# Patient Record
Sex: Male | Born: 1955 | Race: White | Hispanic: No | Marital: Married | Smoking: Never smoker
Health system: Southern US, Community
[De-identification: ages and names within clinical notes are randomized; demographics above are authoritative.]

## PROBLEM LIST (undated history)

## (undated) DIAGNOSIS — D126 Benign neoplasm of colon, unspecified: Secondary | ICD-10-CM

## (undated) DIAGNOSIS — K859 Acute pancreatitis without necrosis or infection, unspecified: Secondary | ICD-10-CM

## (undated) DIAGNOSIS — K648 Other hemorrhoids: Secondary | ICD-10-CM

## (undated) DIAGNOSIS — J189 Pneumonia, unspecified organism: Secondary | ICD-10-CM

## (undated) DIAGNOSIS — E785 Hyperlipidemia, unspecified: Secondary | ICD-10-CM

## (undated) DIAGNOSIS — K5790 Diverticulosis of intestine, part unspecified, without perforation or abscess without bleeding: Secondary | ICD-10-CM

## (undated) DIAGNOSIS — K219 Gastro-esophageal reflux disease without esophagitis: Secondary | ICD-10-CM

## (undated) DIAGNOSIS — I10 Essential (primary) hypertension: Secondary | ICD-10-CM

## (undated) HISTORY — DX: Other hemorrhoids: K64.8

## (undated) HISTORY — DX: Gilbert syndrome: E80.4

## (undated) HISTORY — DX: Benign neoplasm of colon, unspecified: D12.6

## (undated) HISTORY — DX: Gastro-esophageal reflux disease without esophagitis: K21.9

## (undated) HISTORY — DX: Diverticulosis of intestine, part unspecified, without perforation or abscess without bleeding: K57.90

## (undated) HISTORY — PX: CHOLECYSTECTOMY: SHX55

## (undated) HISTORY — DX: Acute pancreatitis without necrosis or infection, unspecified: K85.90

## (undated) HISTORY — DX: Hyperlipidemia, unspecified: E78.5

## (undated) HISTORY — DX: Pneumonia, unspecified organism: J18.9

## (undated) HISTORY — DX: Essential (primary) hypertension: I10

---

## 1999-09-28 ENCOUNTER — Encounter: Payer: Self-pay | Admitting: Emergency Medicine

## 1999-09-28 ENCOUNTER — Emergency Department (HOSPITAL_COMMUNITY): Admission: EM | Admit: 1999-09-28 | Discharge: 1999-09-28 | Payer: Self-pay | Admitting: Emergency Medicine

## 1999-09-30 ENCOUNTER — Encounter (INDEPENDENT_AMBULATORY_CARE_PROVIDER_SITE_OTHER): Payer: Self-pay | Admitting: Specialist

## 1999-09-30 ENCOUNTER — Inpatient Hospital Stay (HOSPITAL_COMMUNITY): Admission: EM | Admit: 1999-09-30 | Discharge: 1999-10-02 | Payer: Self-pay | Admitting: Emergency Medicine

## 1999-09-30 ENCOUNTER — Encounter (INDEPENDENT_AMBULATORY_CARE_PROVIDER_SITE_OTHER): Payer: Self-pay | Admitting: *Deleted

## 1999-09-30 ENCOUNTER — Encounter: Payer: Self-pay | Admitting: Emergency Medicine

## 1999-10-01 ENCOUNTER — Encounter: Payer: Self-pay | Admitting: Family Medicine

## 1999-10-01 ENCOUNTER — Encounter: Payer: Self-pay | Admitting: Emergency Medicine

## 2000-08-01 ENCOUNTER — Emergency Department (HOSPITAL_COMMUNITY): Admission: EM | Admit: 2000-08-01 | Discharge: 2000-08-02 | Payer: Self-pay | Admitting: *Deleted

## 2001-01-18 ENCOUNTER — Emergency Department (HOSPITAL_COMMUNITY): Admission: EM | Admit: 2001-01-18 | Discharge: 2001-01-18 | Payer: Self-pay | Admitting: Emergency Medicine

## 2001-03-08 ENCOUNTER — Encounter: Payer: Self-pay | Admitting: Emergency Medicine

## 2001-03-08 ENCOUNTER — Inpatient Hospital Stay (HOSPITAL_COMMUNITY): Admission: EM | Admit: 2001-03-08 | Discharge: 2001-03-10 | Payer: Self-pay | Admitting: Emergency Medicine

## 2001-03-09 ENCOUNTER — Encounter: Payer: Self-pay | Admitting: General Surgery

## 2001-03-26 ENCOUNTER — Encounter: Admission: RE | Admit: 2001-03-26 | Discharge: 2001-03-26 | Payer: Self-pay | Admitting: Gastroenterology

## 2001-03-26 ENCOUNTER — Encounter: Payer: Self-pay | Admitting: Gastroenterology

## 2001-09-01 ENCOUNTER — Encounter: Payer: Self-pay | Admitting: Emergency Medicine

## 2001-09-01 ENCOUNTER — Emergency Department (HOSPITAL_COMMUNITY): Admission: EM | Admit: 2001-09-01 | Discharge: 2001-09-01 | Payer: Self-pay | Admitting: Emergency Medicine

## 2001-09-01 ENCOUNTER — Encounter: Payer: Self-pay | Admitting: Family Medicine

## 2001-09-04 ENCOUNTER — Emergency Department (HOSPITAL_COMMUNITY): Admission: EM | Admit: 2001-09-04 | Discharge: 2001-09-04 | Payer: Self-pay | Admitting: Emergency Medicine

## 2001-09-04 ENCOUNTER — Encounter: Payer: Self-pay | Admitting: Emergency Medicine

## 2001-09-28 ENCOUNTER — Inpatient Hospital Stay (HOSPITAL_COMMUNITY): Admission: EM | Admit: 2001-09-28 | Discharge: 2001-09-30 | Payer: Self-pay | Admitting: Emergency Medicine

## 2001-09-28 ENCOUNTER — Encounter: Payer: Self-pay | Admitting: Gastroenterology

## 2001-09-28 ENCOUNTER — Encounter (INDEPENDENT_AMBULATORY_CARE_PROVIDER_SITE_OTHER): Payer: Self-pay | Admitting: *Deleted

## 2001-09-30 ENCOUNTER — Encounter (INDEPENDENT_AMBULATORY_CARE_PROVIDER_SITE_OTHER): Payer: Self-pay | Admitting: *Deleted

## 2002-03-12 ENCOUNTER — Inpatient Hospital Stay (HOSPITAL_COMMUNITY): Admission: EM | Admit: 2002-03-12 | Discharge: 2002-03-13 | Payer: Self-pay | Admitting: Emergency Medicine

## 2002-03-12 ENCOUNTER — Encounter: Payer: Self-pay | Admitting: Emergency Medicine

## 2002-03-14 ENCOUNTER — Ambulatory Visit (HOSPITAL_COMMUNITY): Admission: RE | Admit: 2002-03-14 | Discharge: 2002-03-14 | Payer: Self-pay | Admitting: Gastroenterology

## 2002-03-14 ENCOUNTER — Encounter: Payer: Self-pay | Admitting: Gastroenterology

## 2002-03-15 ENCOUNTER — Ambulatory Visit (HOSPITAL_COMMUNITY): Admission: RE | Admit: 2002-03-15 | Discharge: 2002-03-15 | Payer: Self-pay | Admitting: Gastroenterology

## 2002-03-15 ENCOUNTER — Encounter: Payer: Self-pay | Admitting: Gastroenterology

## 2003-02-24 ENCOUNTER — Emergency Department (HOSPITAL_COMMUNITY): Admission: EM | Admit: 2003-02-24 | Discharge: 2003-02-24 | Payer: Self-pay | Admitting: Emergency Medicine

## 2003-05-12 ENCOUNTER — Emergency Department (HOSPITAL_COMMUNITY): Admission: EM | Admit: 2003-05-12 | Discharge: 2003-05-13 | Payer: Self-pay | Admitting: Emergency Medicine

## 2003-08-09 ENCOUNTER — Observation Stay (HOSPITAL_COMMUNITY): Admission: EM | Admit: 2003-08-09 | Discharge: 2003-08-10 | Payer: Self-pay | Admitting: Emergency Medicine

## 2003-08-18 ENCOUNTER — Encounter: Admission: RE | Admit: 2003-08-18 | Discharge: 2003-08-18 | Payer: Self-pay | Admitting: Internal Medicine

## 2004-10-26 ENCOUNTER — Ambulatory Visit: Payer: Self-pay | Admitting: Gastroenterology

## 2004-10-27 ENCOUNTER — Encounter (INDEPENDENT_AMBULATORY_CARE_PROVIDER_SITE_OTHER): Payer: Self-pay | Admitting: Specialist

## 2004-10-27 ENCOUNTER — Ambulatory Visit: Payer: Self-pay | Admitting: Gastroenterology

## 2004-11-08 ENCOUNTER — Encounter: Admission: RE | Admit: 2004-11-08 | Discharge: 2004-11-08 | Payer: Self-pay | Admitting: Gastroenterology

## 2004-12-03 ENCOUNTER — Ambulatory Visit (HOSPITAL_COMMUNITY): Admission: RE | Admit: 2004-12-03 | Discharge: 2004-12-03 | Payer: Self-pay | Admitting: Gastroenterology

## 2004-12-03 ENCOUNTER — Ambulatory Visit: Payer: Self-pay | Admitting: Gastroenterology

## 2005-02-18 ENCOUNTER — Ambulatory Visit: Payer: Self-pay | Admitting: Gastroenterology

## 2005-12-24 DIAGNOSIS — D126 Benign neoplasm of colon, unspecified: Secondary | ICD-10-CM

## 2005-12-24 HISTORY — DX: Benign neoplasm of colon, unspecified: D12.6

## 2005-12-26 ENCOUNTER — Encounter (INDEPENDENT_AMBULATORY_CARE_PROVIDER_SITE_OTHER): Payer: Self-pay | Admitting: *Deleted

## 2006-01-13 ENCOUNTER — Encounter (INDEPENDENT_AMBULATORY_CARE_PROVIDER_SITE_OTHER): Payer: Self-pay | Admitting: *Deleted

## 2006-01-13 ENCOUNTER — Ambulatory Visit (HOSPITAL_COMMUNITY): Admission: RE | Admit: 2006-01-13 | Discharge: 2006-01-13 | Payer: Self-pay | Admitting: Gastroenterology

## 2006-05-22 ENCOUNTER — Ambulatory Visit: Payer: Self-pay | Admitting: Gastroenterology

## 2006-07-08 IMAGING — CT CT ABDOMEN W/ CM
1 of 6 series · 14 of 36 positions shown, 18 images · IV contrast (VOLUMEN & [ID] OMNI 300)
Comparison: none

CLINICAL DATA: Abdominal pain.  Gastroenteritis.  
ABDOMEN CT WITH CONTRAST:
TECHNIQUE: Multidetector CT imaging of the abdomen was performed following the standard protocol during bolus administration of intravenous contrast.
Contrast:  125 cc Omnipaque 300 and oral contrast.  Negative contrast was utilized to visualize the stomach and small bowel.  In addition to the axial images sagittal and coronal images were reconstructed and reviewed.
TECHNIQUE: Multidetector CT imaging of the pelvis was performed following the standard protocol during bolus administration of intravenous contrast.

[Series 4: recon 3: routine abdomen · axial · 0.86mm/px · z∈[-402,+17]mm · 14 of 369 slices shown, 18 images]
[im 17/369  soft-tissue]
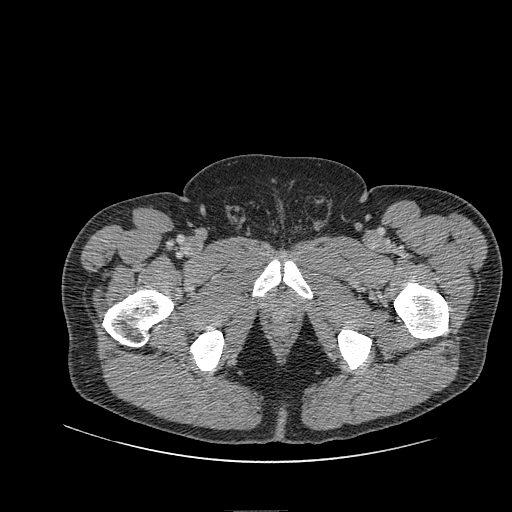
[im 17/369  bone]
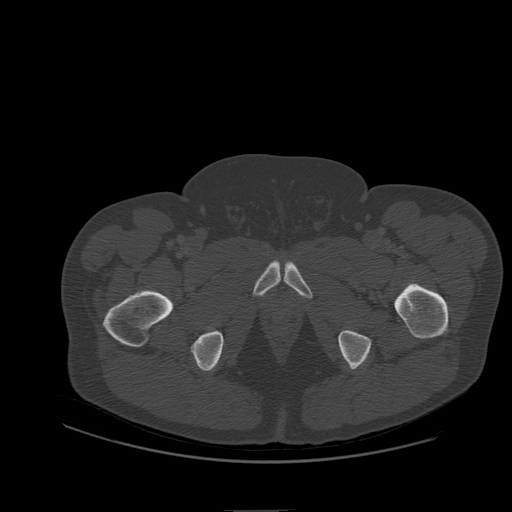
[im 51/369  soft-tissue]
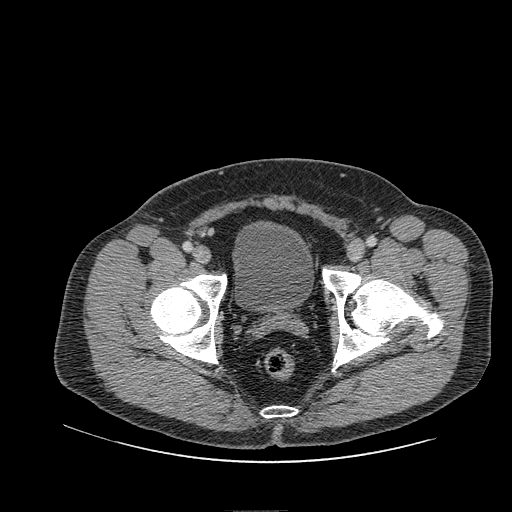
[im 84/369  soft-tissue]
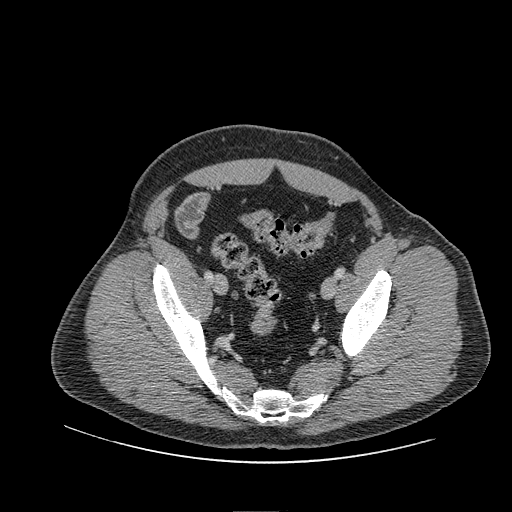
[im 118/369  soft-tissue]
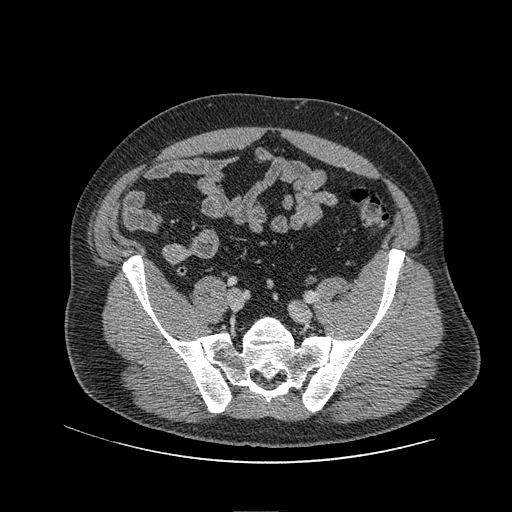
[im 134/369  soft-tissue]
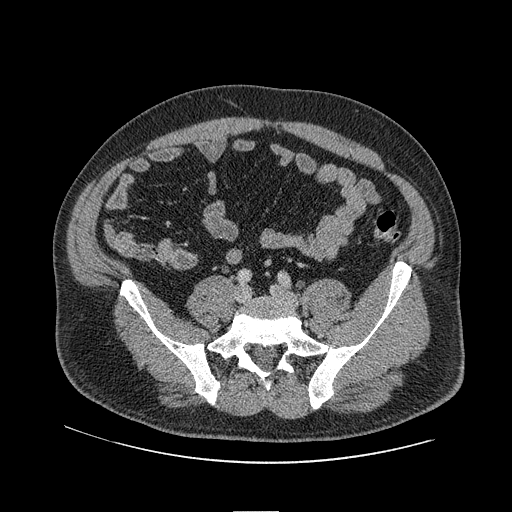
[im 168/369  soft-tissue]
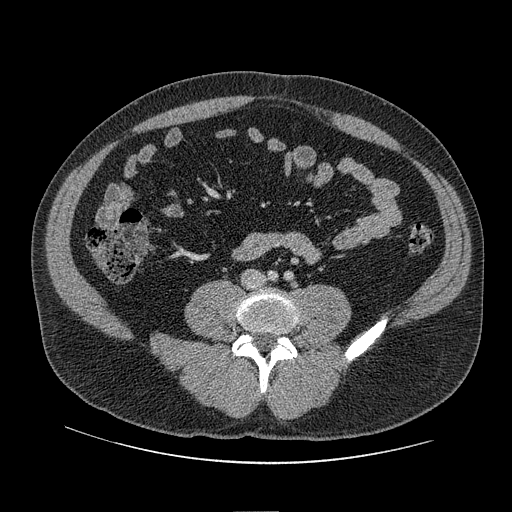
[im 201/369  soft-tissue]
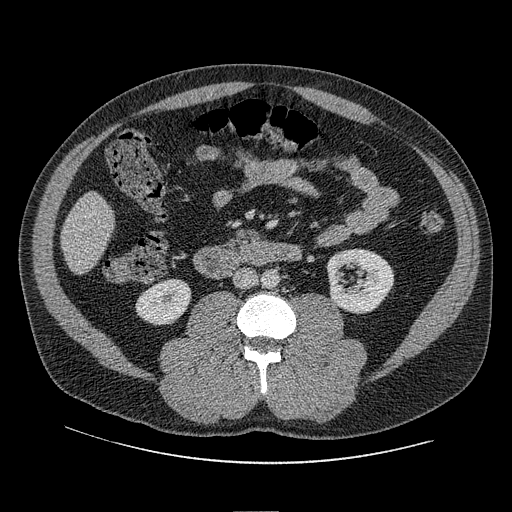
[im 235/369  soft-tissue]
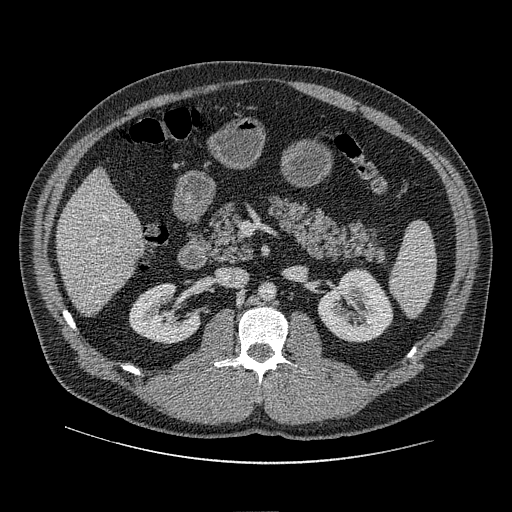
[im 251/369  soft-tissue]
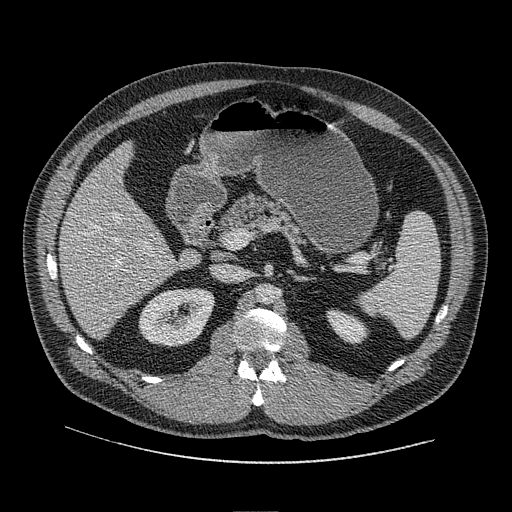
[im 251/369  bone]
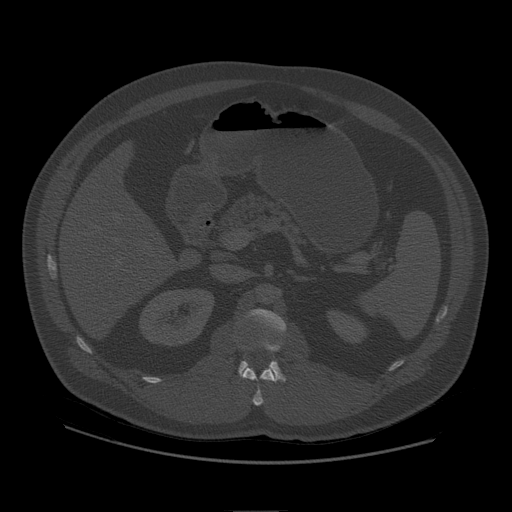
[im 285/369  soft-tissue]
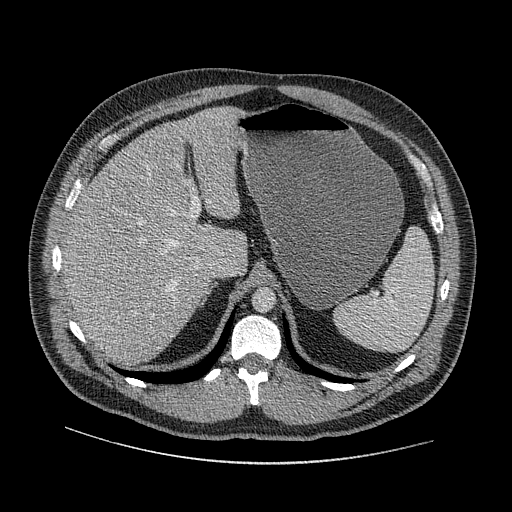
[im 302/369  lung]
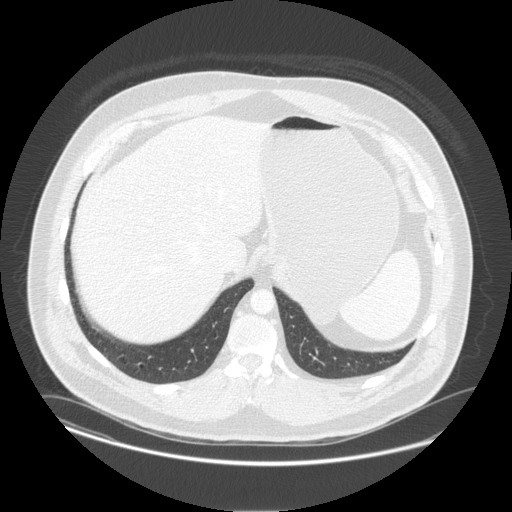
[im 318/369  soft-tissue]
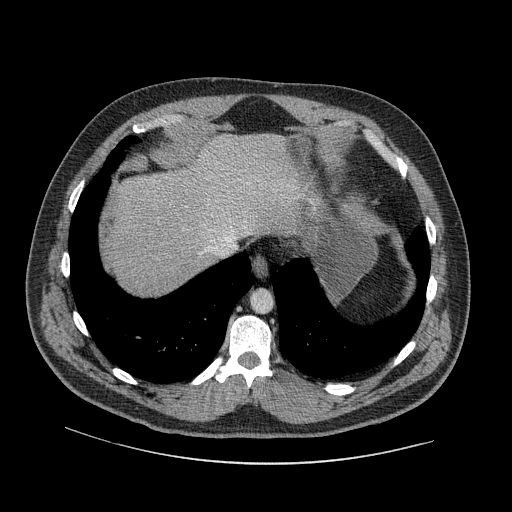
[im 318/369  lung]
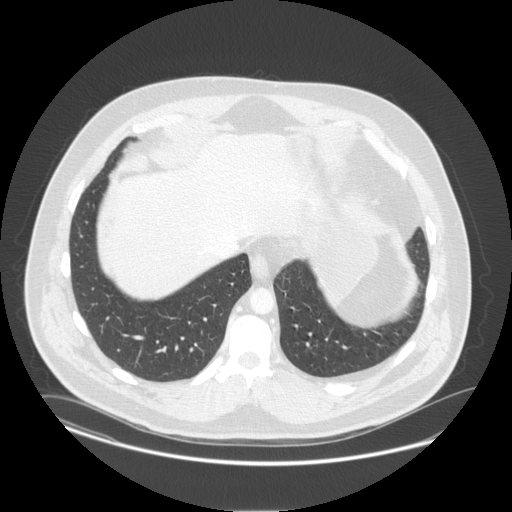
[im 335/369  lung]
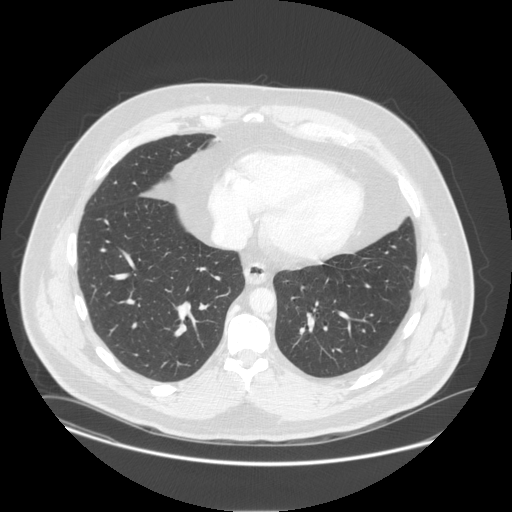
[im 352/369  soft-tissue]
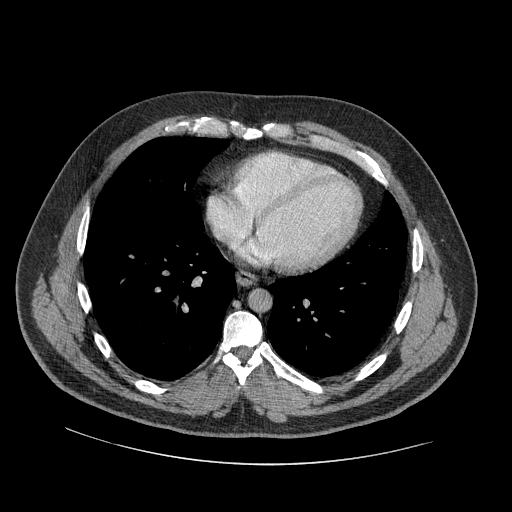
[im 352/369  lung]
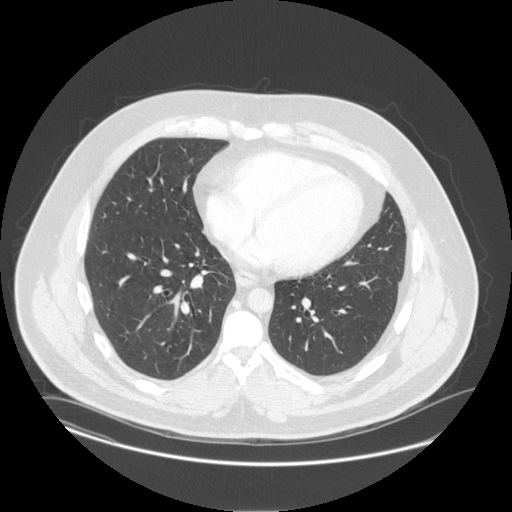

[14 of 36 positions shown; findings below may reference images not displayed]

FINDINGS: The lung bases are clear.  The liver enhances normally with no focal abnormality and no ductal dilatation is seen.  Surgical clips are present from prior cholecystectomy.  The stomach is well distended with  negative oral contrast.  Within the duodenal bulb medially there is an oval soft tissue mass present.  This measures approximately 30 x 15 mm on image number 31.  A small amount of air is located just medial to this ?soft tissue mass? and this may be air trapped behind a possible duodenal bulb lesion.  It is unusual to see a sizable duodenal diverticulum at this location although redundant mucosa of a duodenal diverticulum is difficult to exclude.  Duodenal neoplasm is also a primary consideration.  The remainder of the small bowel appears grossly normal although several loops are not well distended and therefore difficult to assess.  The terminal ileum is moderately well seen with no evidence of edema or stricture.  The appendix appears normal.  The remainder of the study shows the pancreas to be normal in size.  The adrenal glands and spleen appear normal.  The kidneys enhance normally and on delayed images the pelvocaliceal systems appear normal.  The ureters are normal in caliber.  Abdominal aorta appears normal.
IMPRESSION: 1.  Oval soft tissue mass in the duodenum ? immediate postbulbar duodenum located along the medial wall measuring 30 x 15 mm.  Consider neoplasm although redundant mucosa of a duodenal diverticulum is also a consideration. 
2.  Portions of the small bowel are not well distended and therefore cannot be assessed.  The appendix and terminal ileum appear normal. 
PELVIS CT WITH CONTRAST:
FINDINGS: The distal portion of the appendix is well seen and appears normal.  Rectosigmoid colonic diverticula are present.  The urinary bladder is unremarkable.  No bony abnormality is seen.
IMPRESSION: Negative CT of the pelvis. 
The findings of this study were discussed with Dr. Marelys Servante, and repeat endoscopy is to be performed.

## 2007-03-14 ENCOUNTER — Ambulatory Visit: Payer: Self-pay | Admitting: Gastroenterology

## 2008-04-08 ENCOUNTER — Encounter: Payer: Self-pay | Admitting: Gastroenterology

## 2008-05-22 ENCOUNTER — Ambulatory Visit: Payer: Self-pay | Admitting: Gastroenterology

## 2008-05-22 DIAGNOSIS — K219 Gastro-esophageal reflux disease without esophagitis: Secondary | ICD-10-CM

## 2008-05-22 DIAGNOSIS — Z860100 Personal history of colon polyps, unspecified: Secondary | ICD-10-CM | POA: Insufficient documentation

## 2008-06-10 ENCOUNTER — Encounter: Payer: Self-pay | Admitting: Gastroenterology

## 2008-08-28 ENCOUNTER — Telehealth: Payer: Self-pay | Admitting: Physician Assistant

## 2008-08-28 ENCOUNTER — Telehealth: Payer: Self-pay | Admitting: Gastroenterology

## 2008-08-28 ENCOUNTER — Ambulatory Visit: Payer: Self-pay | Admitting: Gastroenterology

## 2008-08-28 DIAGNOSIS — R141 Gas pain: Secondary | ICD-10-CM | POA: Insufficient documentation

## 2008-08-28 DIAGNOSIS — R143 Flatulence: Secondary | ICD-10-CM

## 2008-08-28 DIAGNOSIS — R1084 Generalized abdominal pain: Secondary | ICD-10-CM | POA: Insufficient documentation

## 2008-08-28 DIAGNOSIS — R142 Eructation: Secondary | ICD-10-CM

## 2008-08-28 LAB — CONVERTED CEMR LAB
ALT: 30 units/L (ref 0–53)
AST: 20 units/L (ref 0–37)
Albumin: 4.2 g/dL (ref 3.5–5.2)
Alkaline Phosphatase: 24 units/L — ABNORMAL LOW (ref 39–117)
BUN: 14 mg/dL (ref 6–23)
Basophils Absolute: 0 10*3/uL (ref 0.0–0.1)
Basophils Relative: 0.2 % (ref 0.0–3.0)
CO2: 25 meq/L (ref 19–32)
Calcium: 9.4 mg/dL (ref 8.4–10.5)
Chloride: 101 meq/L (ref 96–112)
Creatinine, Ser: 1.1 mg/dL (ref 0.4–1.5)
Eosinophils Absolute: 0.1 10*3/uL (ref 0.0–0.7)
Eosinophils Relative: 0.5 % (ref 0.0–5.0)
GFR calc non Af Amer: 74.76 mL/min (ref >60)
Glucose, Bld: 113 mg/dL — ABNORMAL HIGH (ref 70–99)
HCT: 45.4 % (ref 39.0–52.0)
Hemoglobin: 16.1 g/dL (ref 13.0–17.0)
Lipase: 8 units/L — ABNORMAL LOW (ref 11.0–59.0)
Lymphocytes Relative: 23.6 % (ref 12.0–46.0)
Lymphs Abs: 2.4 10*3/uL (ref 0.7–4.0)
MCHC: 35.5 g/dL (ref 30.0–36.0)
MCV: 90.6 fL (ref 78.0–100.0)
Monocytes Absolute: 0.9 10*3/uL (ref 0.1–1.0)
Monocytes Relative: 9.1 % (ref 3.0–12.0)
Neutro Abs: 6.9 10*3/uL (ref 1.4–7.7)
Neutrophils Relative %: 66.6 % (ref 43.0–77.0)
Platelets: 238 10*3/uL (ref 150.0–400.0)
Potassium: 3.8 meq/L (ref 3.5–5.1)
RBC: 5.02 M/uL (ref 4.22–5.81)
RDW: 12.1 % (ref 11.5–14.6)
Sodium: 137 meq/L (ref 135–145)
Total Bilirubin: 1.3 mg/dL — ABNORMAL HIGH (ref 0.3–1.2)
Total Protein: 7 g/dL (ref 6.0–8.3)
WBC: 10.3 10*3/uL (ref 4.5–10.5)

## 2008-09-01 ENCOUNTER — Telehealth: Payer: Self-pay | Admitting: Gastroenterology

## 2008-09-01 DIAGNOSIS — R197 Diarrhea, unspecified: Secondary | ICD-10-CM

## 2008-09-01 DIAGNOSIS — R110 Nausea: Secondary | ICD-10-CM

## 2008-09-02 ENCOUNTER — Inpatient Hospital Stay (HOSPITAL_COMMUNITY): Admission: AD | Admit: 2008-09-02 | Discharge: 2008-09-04 | Payer: Self-pay | Admitting: Gastroenterology

## 2008-09-02 ENCOUNTER — Ambulatory Visit: Payer: Self-pay | Admitting: Internal Medicine

## 2008-09-02 ENCOUNTER — Ambulatory Visit: Payer: Self-pay | Admitting: Gastroenterology

## 2008-09-02 ENCOUNTER — Telehealth: Payer: Self-pay | Admitting: Gastroenterology

## 2008-09-04 ENCOUNTER — Telehealth: Payer: Self-pay | Admitting: Gastroenterology

## 2008-10-07 ENCOUNTER — Ambulatory Visit: Payer: Self-pay | Admitting: Gastroenterology

## 2008-11-27 ENCOUNTER — Encounter (INDEPENDENT_AMBULATORY_CARE_PROVIDER_SITE_OTHER): Payer: Self-pay | Admitting: *Deleted

## 2009-02-12 ENCOUNTER — Telehealth: Payer: Self-pay | Admitting: Gastroenterology

## 2009-07-08 ENCOUNTER — Telehealth: Payer: Self-pay | Admitting: Gastroenterology

## 2009-07-28 ENCOUNTER — Encounter (INDEPENDENT_AMBULATORY_CARE_PROVIDER_SITE_OTHER): Payer: Self-pay | Admitting: *Deleted

## 2009-09-02 ENCOUNTER — Encounter (INDEPENDENT_AMBULATORY_CARE_PROVIDER_SITE_OTHER): Payer: Self-pay | Admitting: *Deleted

## 2009-09-07 ENCOUNTER — Ambulatory Visit: Payer: Self-pay | Admitting: Gastroenterology

## 2009-09-07 ENCOUNTER — Encounter (INDEPENDENT_AMBULATORY_CARE_PROVIDER_SITE_OTHER): Payer: Self-pay | Admitting: *Deleted

## 2009-09-18 ENCOUNTER — Ambulatory Visit: Payer: Self-pay | Admitting: Gastroenterology

## 2009-09-21 ENCOUNTER — Encounter: Payer: Self-pay | Admitting: Gastroenterology

## 2010-02-25 NOTE — Miscellaneous (Signed)
Summary: [facility]  Clinical Lists Changes  Medications: Added new medication of MOVIPREP 100 GM  SOLR (PEG-KCL-NACL-NASULF-NA ASC-C) As per prep instructions. - Signed Rx of MOVIPREP 100 GM  SOLR (PEG-KCL-NACL-NASULF-NA ASC-C) As per prep instructions.;  #1 x 0;  Signed;  Entered by: Ezra Sites RN;  Authorized by: Meryl Dare MD Clementeen Graham;  Method used: Electronically to [facility]*, [address], [place], [place]  [address], Ph: [phone] or [phone], Fax: [phone] Observations: Added new observation of ALLERGY REV: Done (09/07/2009 7:48)    Prescriptions: MOVIPREP 100 GM  SOLR (PEG-KCL-NACL-NASULF-NA ASC-C) As per prep instructions.  #1 x 0   Entered by:   Ezra Sites RN   Authorized by:   Meryl Dare MD [facility]   Signed by:   Ezra Sites RN on 09/07/2009   Method used:   Electronically to        [facility]* (retail)       [address] [place], [place]  [address]       Ph: [phone] or [phone]       Fax: [phone]   RxID:   [id number]

## 2010-02-25 NOTE — Letter (Signed)
Summary: Diabetic Instructions  [facility]  [address] [place], [place] [address]   Phone: [phone]  Fax: [phone]    KODA ROUTON 12-31-1955 MRN: [id number]   (Glucophage)  ORAL DIABETIC MEDICATION INSTRUCTIONS  The day before your procedure:   Take your diabetic pill as you do normally  The day of your procedure:   Do not take your diabetic pill    We will check your blood sugar levels during the admission process and again in Recovery before discharging you home  ________________________________________________________________________

## 2010-02-25 NOTE — Progress Notes (Signed)
Summary: Triage   Phone Note Call from Patient Call back at [phone]   Caller: Wife  Dina Call For: Dr. Russella Dar Reason for Call: Talk to Nurse Summary of Call: Pt.'s wife said he is taking a "green" pill. Doesn't know the name of it and says it is not working. Wants something else called in Initial call taken by: Karna Christmas,  February 12, 2009 3:02 PM  Follow-up for Phone Call        Patient  had some nausea that started after some dental work and working last night. The green pill she is referring to is Levsin.  I advised the wife that levsin is not for nausea.  She says hsi stomach is not relaxed.  I advised her to increasae to 2 levsin q 4-6 hours as needed.   Follow-up by: Darcey Nora RN, CGRN,  February 12, 2009 3:28 PM

## 2010-02-25 NOTE — Procedures (Signed)
Summary: Colonoscopy  Patient: Pedro [name] Note: All result statuses are Final unless otherwise noted.  Tests: (1) Colonoscopy (COL)   COL Colonoscopy           DONE     [facility]     [address]     [place], [place]  [address]           COLONOSCOPY PROCEDURE REPORT     PATIENT:  Pedro, [name]  MR#:  [id number]     BIRTHDATE:  26-May-1955, 53 yrs. old  GENDER:  male     ENDOSCOPIST:  Judie Petit T. 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     MEDICATIONS:   Fentanyl 50 mcg IV, Versed 6 mg IV     DESCRIPTION OF PROCEDURE:   After the risks benefits and     alternatives of the procedure were thoroughly explained, informed     consent was obtained.  Digital rectal exam was performed and     revealed no abnormalities.   The LB [id number]-[id number] endoscope     was introduced through the anus and advanced to the cecum, which     was identified by both the appendix and ileocecal valve, without     limitations.  The quality of the prep was excellent, using     MoviPrep.  The instrument was then slowly withdrawn as the colon     was fully examined.     <<PROCEDUREIMAGES>>     FINDINGS:  Mild diverticulosis was found in the sigmoid colon. A     normal appearing cecum, ileocecal valve, and appendiceal orifice     were identified. The ascending, hepatic flexure, transverse,     splenic flexure, descending colon, and rectum appeared     unremarkable. Retroflexed views in the rectum revealed no     abnormalities. The time to cecum =  2  minutes. The scope was then     withdrawn (time =  9  min) from the patient and the procedure     completed.     COMPLICATIONS:  None           ENDOSCOPIC IMPRESSION:     1) Mild diverticulosis in the sigmoid colon           RECOMMENDATIONS:     1) High  fiber diet with liberal fluid intake.     2) Repeat Colonoscopy in 5 years.           Venita Lick. Russella Dar, MD, [facility]           CC: [facility]           n.     Rosalie DoctorVenita Lick. Stark at 09/18/2009 11:25 AM           Michel Bickers, [id number]  Note: An exclamation mark (!) indicates a result that was not dispersed into the flowsheet. Document Creation Date: 09/18/2009 11:26 AM _______________________________________________________________________  (1) Order result status: Final Collection or observation date-time: 09/18/2009 11:23 Requested date-time:  Receipt date-time:  Reported date-time:  Referring Physician:   Ordering Physician: Claudette Head [phone]) Specimen Source:  Source: Launa Grill Order Number: [id number] Lab site:   Appended  Document: Colonoscopy    Clinical Lists Changes  Observations: Added new observation of COLONNXTDUE: 08/2014 (09/18/2009 11:32)

## 2010-02-25 NOTE — Progress Notes (Signed)
Summary: Schedule Colonoscopy   Phone Note Outgoing Call Call back at [facility] Phone [phone]   Call placed by: Harlow Mares CMA Duncan Dull),  July 08, 2009 4:24 PM Call placed to: Patient Summary of Call: Left a message on the patient machine to call back and schedule a previsit and procedure with our office. pt due for colonoscopy  Initial call taken by: Harlow Mares CMA Duncan Dull),  July 08, 2009 4:24 PM  Follow-up for Phone Call        Left a message on the patient machine to call back and schedule a previsit and procedure with our office. A letter will be mailed to the patient.  Follow-up by: Harlow Mares CMA (AAMA),  July 23, 2009 12:12 PM

## 2010-02-25 NOTE — Miscellaneous (Signed)
Summary: Hyomax refill  Clinical Lists Changes  Medications: Changed medication from LEVSIN 0.125 MG TABS (HYOSCYAMINE SULFATE) Put tab under tongue as needed to HYOMAX-SL 0.125 MG SUBL (HYOSCYAMINE SULFATE) 1 tablets by mouth under tongue four times a day as needed - Signed Rx of HYOMAX-SL 0.125 MG SUBL (HYOSCYAMINE SULFATE) 1 tablets by mouth under tongue four times a day as needed;  #360 x 3;  Signed;  Entered by: Christie Nottingham CMA (AAMA);  Authorized by: Meryl Dare MD Clementeen Graham;  Method used: Electronically to [facility]*, , ,   , Ph: [phone], Fax: [phone]    Prescriptions: HYOMAX-SL 0.125 MG SUBL (HYOSCYAMINE SULFATE) 1 tablets by mouth under tongue four times a day as needed  #360 x 3   Entered by:   Christie Nottingham CMA (AAMA)   Authorized by:   Meryl Dare MD [facility]   Signed by:   Christie Nottingham CMA (AAMA) on 09/21/2009   Method used:   Electronically to        [facility]* (retail)             ,          Ph: [phone]       Fax: [phone]   RxID:   [id number]

## 2010-02-25 NOTE — Letter (Signed)
Summary: [facility] Instructions  [facility]  [address] [place], [place] [address]   Phone: [phone]  Fax: [phone]       Pedro [name]    09/10/1955    MRN: [id number]        Procedure Day /Date:  Friday 09/18/2009     Arrival Time: 9:30 am      Procedure Time: 10:30 am     Location of Procedure:                    _x _   Endoscopy Center (4th Floor)                        PREPARATION FOR COLONOSCOPY WITH MOVIPREP   Starting 5 days prior to your procedure Sunday 8/21 do not eat nuts, seeds, popcorn, corn, beans, peas,  salads, or any raw vegetables.  Do not take any fiber supplements (e.g. Metamucil, Citrucel, and Benefiber).  THE DAY BEFORE YOUR PROCEDURE         DATE: Thursday 8/25 1.  Drink clear liquids the entire day-NO SOLID FOOD  2.  Do not drink anything colored red or purple.  Avoid juices with pulp.  No orange juice.  3.  Drink at least 64 oz. (8 glasses) of fluid/clear liquids during the day to prevent dehydration and help the prep work efficiently.  CLEAR LIQUIDS INCLUDE: Water Jello Ice Popsicles Tea (sugar ok, no milk/cream) Powdered fruit flavored drinks Coffee (sugar ok, no milk/cream) Gatorade Juice: apple, white grape, white cranberry  Lemonade Clear bullion, consomm, broth Carbonated beverages (any kind) Strained chicken noodle soup Hard Candy                             4.  In the morning, mix first dose of MoviPrep solution:    Empty 1 Pouch A and 1 Pouch B into the disposable container    Add lukewarm drinking water to the top line of the container. Mix to dissolve    Refrigerate (mixed solution should be used within 24 hrs)  5.  Begin drinking the prep at 5:00 p.m. The MoviPrep container is divided by 4 marks.   Every 15 minutes drink the solution down to the next mark (approximately 8 oz) until the full liter is complete.   6.  Follow completed prep with 16 oz of clear liquid of your choice (Nothing  red or purple).  Continue to drink clear liquids until bedtime.  7.  Before going to bed, mix second dose of MoviPrep solution:    Empty 1 Pouch A and 1 Pouch B into the disposable container    Add lukewarm drinking water to the top line of the container. Mix to dissolve    Refrigerate  THE DAY OF YOUR PROCEDURE      DATE: Friday 8/26  Beginning at 5:30 a.m. (5 hours before procedure):         1. Every 15 minutes, drink the solution down to the next mark (approx 8 oz) until the full liter is complete.  2. Follow completed prep with 16 oz. of clear liquid of your choice.    3. You may drink clear liquids until 8:30 am (2 HOURS BEFORE PROCEDURE).   MEDICATION INSTRUCTIONS  Unless otherwise instructed, you should take regular prescription medications with a small sip of water   as early as possible the morning of your  procedure.  Diabetic patients - see separate instructions.   Additional medication instructions: Hold Lisinopril/HCTZ day of procedure.         OTHER INSTRUCTIONS  You will need a responsible adult at least 54 years of age to accompany you and drive you home.   This person must remain in the waiting room during your procedure.  Wear loose fitting clothing that is easily removed.  Leave jewelry and other valuables at home.  However, you may wish to bring a book to read or  an iPod/MP3 player to listen to music as you wait for your procedure to start.  Remove all body piercing jewelry and leave at home.  Total time from sign-in until discharge is approximately 2-3 hours.  You should go home directly after your procedure and rest.  You can resume normal activities the  day after your procedure.  The day of your procedure you should not:   Drive   Make legal decisions   Operate machinery   Drink alcohol   Return to work  You will receive specific instructions about eating, activities and medications before you leave.    The above instructions  have been reviewed and explained to me by   Ezra Sites RN  September 07, 2009 8:09 AM     I fully understand and can verbalize these instructions _____________________________ Date _________

## 2010-02-25 NOTE — Letter (Signed)
Summary: Previsit letter  [facility]  [address] [place], [place] [address]   Phone: [phone]  Fax: [phone]       07/28/2009 MRN: [id number]  Pedro [name] [address] [place], [place]  [address]  Dear [name],  Welcome to the Gastroenterology Division at [facility].    You are scheduled to see a nurse for your pre-procedure visit on 09/07/2009 at 8:00am on the 3rd floor at [facility], [address]  We ask that you try to arrive at our office 15 minutes prior to your appointment time to allow for check-in.  Your nurse visit will consist of discussing your medical and surgical history, your immediate family medical history, and your medications.    Please bring a complete list of all your medications or, if you prefer, bring the medication bottles and we will list them.  We will need to be aware of both prescribed and over the counter drugs.  We will need to know exact dosage information as well.  If you are on blood thinners (Coumadin, Plavix, Aggrenox, Ticlid, etc.) please call our office today/prior to your appointment, as we need to consult with your physician about holding your medication.   Please be prepared to read and sign documents such as consent forms, a financial agreement, and acknowledgement forms.  If necessary, and with your consent, a friend or relative is welcome to sit-in on the nurse visit with you.  Please bring your insurance card so that we may make a copy of it.  If your insurance requires a referral to see a specialist, please bring your referral form from your primary care physician.  No co-pay is required for this nurse visit.     If you cannot keep your appointment, please call [phone] to cancel or reschedule prior to your appointment date.  This allows [place] the opportunity to schedule an appointment for another patient in need of care.    Thank you for choosing [facility] for your medical  needs.  We appreciate the opportunity to care for you.  Please visit [place] at our website  to learn more about our practice.                     Sincerely.                                                                                                                   The Gastroenterology Division

## 2010-04-06 ENCOUNTER — Encounter: Payer: Self-pay | Admitting: Family Medicine

## 2010-04-06 ENCOUNTER — Ambulatory Visit (INDEPENDENT_AMBULATORY_CARE_PROVIDER_SITE_OTHER): Payer: 59 | Admitting: Family Medicine

## 2010-04-06 ENCOUNTER — Other Ambulatory Visit: Payer: Self-pay | Admitting: Family Medicine

## 2010-04-06 DIAGNOSIS — I10 Essential (primary) hypertension: Secondary | ICD-10-CM | POA: Insufficient documentation

## 2010-04-06 DIAGNOSIS — E119 Type 2 diabetes mellitus without complications: Secondary | ICD-10-CM

## 2010-04-06 DIAGNOSIS — E785 Hyperlipidemia, unspecified: Secondary | ICD-10-CM

## 2010-04-06 DIAGNOSIS — E1169 Type 2 diabetes mellitus with other specified complication: Secondary | ICD-10-CM | POA: Insufficient documentation

## 2010-04-06 LAB — HEPATIC FUNCTION PANEL
ALT: 51 U/L (ref 0–53)
AST: 31 U/L (ref 0–37)
Albumin: 4.4 g/dL (ref 3.5–5.2)
Alkaline Phosphatase: 40 U/L (ref 39–117)
Bilirubin, Direct: 0.2 mg/dL (ref 0.0–0.3)
Total Protein: 6.9 g/dL (ref 6.0–8.3)

## 2010-04-06 LAB — LIPID PANEL
Cholesterol: 111 mg/dL (ref 0–200)
HDL: 31.2 mg/dL — ABNORMAL LOW (ref >39.00)
Total CHOL/HDL Ratio: 4
VLDL: 56.4 mg/dL — ABNORMAL HIGH (ref 0.0–40.0)

## 2010-04-06 LAB — LDL CHOLESTEROL, DIRECT: Direct LDL: 40.3 mg/dL

## 2010-04-06 LAB — BASIC METABOLIC PANEL
CO2: 30 mEq/L (ref 19–32)
Chloride: 101 mEq/L (ref 96–112)
GFR: 83.91 mL/min (ref >60.00)
Glucose, Bld: 119 mg/dL — ABNORMAL HIGH (ref 70–99)
Potassium: 4.5 mEq/L (ref 3.5–5.1)
Sodium: 139 mEq/L (ref 135–145)

## 2010-04-06 LAB — HEMOGLOBIN A1C: Hgb A1c MFr Bld: 6 % (ref 4.6–6.5)

## 2010-04-08 ENCOUNTER — Telehealth: Payer: Self-pay | Admitting: Family Medicine

## 2010-04-09 LAB — GLUCOSE, CAPILLARY
Glucose-Capillary: 105 mg/dL — ABNORMAL HIGH (ref 70–99)
Glucose-Capillary: 105 mg/dL — ABNORMAL HIGH (ref 70–99)

## 2010-04-13 NOTE — Assessment & Plan Note (Signed)
Summary: new pt to estab, uhc, ns fee///sph   Vital Signs:  Patient profile:   55 year old male Height:      67 inches (170.18 cm) Weight:      212 pounds (96.36 kg) BMI:     33.32 Temp:     96.9 degrees F (36.06 degrees C) oral BP sitting:   120 / 70  (left arm) Cuff size:   large  Vitals Entered By: Lucious Groves CMA (April 06, 2010 10:14 AM) CC: NP est care--Unhappy with previous PCP./kb Is Patient Diabetic? Yes Pain Assessment Patient in pain? no      Comments Patient notes that he is allergic to a cholesterol med but is unsure of the name. He has been having increased CBG, and blood pressure.   History of Present Illness: 56 yo man here today to establish care.  previous MD- [facility]  DM- on Metformin two times a day.  dx'd 3 yrs ago.  CBGs <100 '95% of the time'.  CBGs recently up in the 110s.  decreased exercise recently due to change in work hrs.  getting yearly eye exams ([facility]).  denies symptomatic lows.  no CP, SOB, HAs, visual changes, edema.  HTN- well controlled today.  Lisinopril/HCTZ.  asymptomatic.  dx'd 5 yrs ago.  Hyperlipidemia- on Simvastatin currently.  was previously taking Lovaza (causes nausea) and Tricor.  these meds were switched in favor of Niacin.  had severe flushing.  tolerating statin w/out difficulty- denies abd pain, N/V, myalgias  Preventive Screening-Counseling & Management  Alcohol-Tobacco     Alcohol drinks/day: <1     Smoking Status: never  Caffeine-Diet-Exercise     Does Patient Exercise: no      Sexual History:  currently monogamous.        Drug Use:  never.    Current Medications (verified): 1)  Omeprazole 20 Mg Cpdr (Omeprazole) .... 2 Capsules By Mouth Every Morning 2)  Lisinopril-Hydrochlorothiazide 20-12.5 Mg Tabs (Lisinopril-Hydrochlorothiazide) .... One Tablet By Mouth Once Daily 3)  Glucophage 500 Mg Tabs (Metformin Hcl) .... One Tablet By Mouth Two Times A Day 4)  Hyomax-Sl 0.125 Mg Subl (Hyoscyamine  Sulfate) .Marland Kitchen.. 1 Tablets By Mouth Under Tongue Four Times A Day As Needed 5)  Simvastatin 40 Mg Tabs (Simvastatin) .Marland Kitchen.. 1 By Mouth Once Daily  Allergies: 1)  ! Penicillin 2)  ! 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  Alcohol Use - no Illicit Drug Use - no Daily Caffeine Use-2 cups daily Patient gets regular exercise. Does Patient Exercise:  no Sexual History:  currently monogamous Drug Use:  never  Review of Systems      See HPI  Physical Exam  General:  Well developed, well nourished, no acute distress. Head:  Normocephalic and atraumatic. Neck:  Supple; no masses or thyromegaly. Lungs:  Clear throughout to auscultation. Heart:  Regular rate and rhythm; no murmurs, rubs,  or bruits. Abdomen:  Soft, nontender and nondistended. No masses, hepatosplenomegaly or hernias noted. Normal bowel sounds. Pulses:  +2 carotid, radial, DP Extremities:  no C/C/E Neurologic:  alert & oriented X3, cranial nerves II-XII intact, and gait normal.   Skin:  turgor normal and color normal.   Cervical Nodes:  No lymphadenopathy noted Psych:  Cognition and judgment appear  intact. Alert and cooperative with normal attention span and concentration. No apparent delusions, illusions, hallucinations   Impression & Recommendations:  Problem # 1:  DIABETES MELLITUS, TYPE II (ICD-250.00) will check labs today to assess pt's level of control and adjust meds as needed.  UTD on eye exam.  pt is very motivated.  applauded his efforts. The following medications were removed from the medication list:    Aspirin 81 Mg Tabs (Aspirin) ..... One tablet by mouth daily His updated medication list for this problem  includes:    Lisinopril-hydrochlorothiazide 20-12.5 Mg Tabs (Lisinopril-hydrochlorothiazide) ..... One tablet by mouth once daily    Glucophage 500 Mg Tabs (Metformin hcl) ..... One tablet by mouth two times a day  Orders: Venipuncture ([address]) TLB-A1C / Hgb A1C (Glycohemoglobin) (83036-A1C) TLB-BMP (Basic Metabolic Panel-BMET) (80048-METABOL) Specimen Handling ([address])  Problem # 2:  HYPERTENSION, BENIGN ESSENTIAL (ICD-401.1) Assessment: Comment Only pt's BP well controlled today.  asymptomatic.  no changes. His updated medication list for this problem includes:    Lisinopril-hydrochlorothiazide 20-12.5 Mg Tabs (Lisinopril-hydrochlorothiazide) ..... One tablet by mouth once daily  Problem # 3:  HYPERLIPIDEMIA (ICD-272.4) pt currently on statin but given pt's report of Tricor, Niacin, and Lovasa wonder if he has problems w/ trigs as well.  will check labs and adjust meds as needed. His updated medication list for this problem includes:    Simvastatin 40 Mg Tabs (Simvastatin) .Marland Kitchen... 1 by mouth once daily  Orders: TLB-Lipid Panel (80061-LIPID) TLB-Hepatic/Liver Function Pnl (80076-HEPATIC) Specimen Handling ([address])  Complete Medication List: 1)  Omeprazole 20 Mg Cpdr (Omeprazole) .... 2 capsules by mouth every morning 2)  Lisinopril-hydrochlorothiazide 20-12.5 Mg Tabs (Lisinopril-hydrochlorothiazide) .... One tablet by mouth once daily 3)  Glucophage 500 Mg Tabs (Metformin hcl) .... One tablet by mouth two times a day 4)  Hyomax-sl 0.125 Mg Subl (Hyoscyamine sulfate) .Marland Kitchen.. 1 tablets by mouth under tongue four times a day as needed 5)  Simvastatin 40 Mg Tabs (Simvastatin) .Marland Kitchen.. 1 by mouth once daily  Patient Instructions: 1)  We'll notify you of your lab results 2)  We'll adjust meds as needed 3)  Call with any questions or concerns 4)  Welcome!  We're glad to have you!!!   Orders Added: 1)  Venipuncture [36415] 2)  TLB-A1C / Hgb A1C (Glycohemoglobin) [83036-A1C] 3)  TLB-BMP (Basic  Metabolic Panel-BMET) [80048-METABOL] 4)  TLB-Lipid Panel [80061-LIPID] 5)  TLB-Hepatic/Liver Function Pnl [80076-HEPATIC] 6)  Specimen Handling [99000] 7)  New Patient Level III [91478]

## 2010-04-13 NOTE — Progress Notes (Signed)
Summary: Tricor refill  Phone Note Refill Request Call back at [phone] Message from:  Patient's wife  Refills Requested: Medication #1:  TRICOR 145 mg [facility]----only has few pills left, didnt want to get a 30 day local prescription,   pt couldnt remember if this med had been added to his list of meds  Next Appointment Scheduled: none Initial call taken by: Jerolyn Shin,  April 08, 2010 1:35 PM  Follow-up for Phone Call        MD just made the patient aware to re-start this med. RX sent. Spouse aware.  Follow-up by: Lucious Groves CMA,  April 08, 2010 1:50 PM    New/Updated Medications: TRICOR 145 MG TABS (FENOFIBRATE) 1 by mouth once daily Prescriptions: TRICOR 145 MG TABS (FENOFIBRATE) 1 by mouth once daily  #90 x 2   Entered by:   Lucious Groves CMA   Authorized by:   Neena Rhymes MD   Signed by:   Lucious Groves CMA on 04/08/2010   Method used:   Faxed to ...       [facility]* (retail)             ,          Ph: [phone]       Fax: [phone]   RxID:   [id number]

## 2010-05-01 LAB — GLUCOSE, CAPILLARY
Glucose-Capillary: 113 mg/dL — ABNORMAL HIGH (ref 70–99)
Glucose-Capillary: 117 mg/dL — ABNORMAL HIGH (ref 70–99)
Glucose-Capillary: 119 mg/dL — ABNORMAL HIGH (ref 70–99)
Glucose-Capillary: 131 mg/dL — ABNORMAL HIGH (ref 70–99)

## 2010-05-01 LAB — COMPREHENSIVE METABOLIC PANEL
ALT: 19 U/L (ref 0–53)
AST: 19 U/L (ref 0–37)
Albumin: 4 g/dL (ref 3.5–5.2)
Alkaline Phosphatase: 25 U/L — ABNORMAL LOW (ref 39–117)
BUN: 14 mg/dL (ref 6–23)
CO2: 26 mEq/L (ref 19–32)
Calcium: 9.1 mg/dL (ref 8.4–10.5)
Chloride: 102 mEq/L (ref 96–112)
Creatinine, Ser: 1.04 mg/dL (ref 0.4–1.5)
GFR calc Af Amer: >60 mL/min (ref >60)
GFR calc non Af Amer: >60 mL/min (ref >60)
Glucose, Bld: 107 mg/dL — ABNORMAL HIGH (ref 70–99)
Potassium: 3.6 mEq/L (ref 3.5–5.1)
Sodium: 134 mEq/L — ABNORMAL LOW (ref 135–145)
Total Bilirubin: 0.7 mg/dL (ref 0.3–1.2)
Total Protein: 6.4 g/dL (ref 6.0–8.3)

## 2010-05-01 LAB — CBC
HCT: 46.1 % (ref 39.0–52.0)
Hemoglobin: 15.8 g/dL (ref 13.0–17.0)
MCHC: 34.3 g/dL (ref 30.0–36.0)
MCV: 93.4 fL (ref 78.0–100.0)
Platelets: 272 10*3/uL (ref 150–400)
RBC: 4.93 MIL/uL (ref 4.22–5.81)
RDW: 12.6 % (ref 11.5–15.5)
WBC: 7.9 10*3/uL (ref 4.0–10.5)

## 2010-05-01 LAB — URINALYSIS, ROUTINE W REFLEX MICROSCOPIC
Bilirubin Urine: NEGATIVE
Glucose, UA: NEGATIVE mg/dL
Hgb urine dipstick: NEGATIVE
Urobilinogen, UA: 1 mg/dL (ref 0.0–1.0)

## 2010-05-01 LAB — LIPASE, BLOOD: Lipase: 30 U/L (ref 11–59)

## 2010-05-01 LAB — AMYLASE: Amylase: 81 U/L (ref 27–131)

## 2010-05-17 ENCOUNTER — Telehealth: Payer: Self-pay | Admitting: Family Medicine

## 2010-05-17 MED ORDER — LISINOPRIL-HYDROCHLOROTHIAZIDE 10-12.5 MG PO TABS: 1.0000 | ORAL_TABLET | Freq: Every day | ORAL | Status: DC

## 2010-05-17 MED ORDER — METFORMIN HCL 500 MG PO TABS: 500.0000 mg | ORAL_TABLET | Freq: Two times a day (BID) | ORAL | Status: DC

## 2010-05-17 MED ORDER — SIMVASTATIN 40 MG PO TABS: 40.0000 mg | ORAL_TABLET | Freq: Every evening | ORAL | Status: DC

## 2010-05-17 MED ORDER — FENOFIBRATE 145 MG PO TABS: 145.0000 mg | ORAL_TABLET | Freq: Every day | ORAL | Status: DC

## 2010-05-17 NOTE — Telephone Encounter (Signed)
Left message on voicemail to call the office

## 2010-05-17 NOTE — Telephone Encounter (Signed)
Tricor appears to have been filled in March per [facility]. Please advise.

## 2010-05-17 NOTE — Telephone Encounter (Signed)
Patient needs refill for metformin 500mg  - simvastatin 40mg  - lisinopril hctz 10/12.5 - tricor 145mg  patient discussed doubling mg of tricor  so it can be cut in half - 90 day supply all meds - [facility]

## 2010-05-17 NOTE — Telephone Encounter (Signed)
Ok to refill all meds.  Please let him know that tricor doesn't come at a higher dose so it cannot be increased and then cut in half.  He needs a f/u appt in June for his regular 3 month diabetes f/u.

## 2010-05-18 NOTE — Telephone Encounter (Signed)
Pt spouse notified. 

## 2010-06-08 NOTE — H&P (Signed)
NAME:  Pedro [name], Pedro [name] NO.:  [id number]   MEDICAL RECORD NO.:  [id number]          PATIENT TYPE:  INP   LOCATION:  5036                         FACILITY:  [facility]   PHYSICIAN:  Rachael Fee, MD   DATE OF BIRTH:  [birth year]   DATE OF ADMISSION:  09/02/2008  DATE OF DISCHARGE:                              HISTORY & PHYSICAL   ADMITTING PHYSICIAN:  Rachael Fee, MD   PRIMARY GASTROINTESTINAL DOCTOR:  Venita Lick. Russella Dar, MD, [facility]   PRIMARY CARE PHYSICIAN:  [facility].  He is followed there by a physician extender named, Eddie Candle.   REASON FOR ADMISSION:  Unrelenting abdominal pain.   HISTORY OF PRESENT ILLNESS:  This is a 55 year old white male with a  history of colon polyps, gastric fundal gland polyps, and bouts with  nausea, vomiting, and abdominal pain that have been occurring for at  least 10 years.  He has undergone extensive evaluations mostly by Dr.  Sherin Quarry and also at [facility] for these bouts where he would get  abdominal pain with nausea, vomiting, but no objective bowel obstruction  on imaging studies.  He has undergone several upper endoscopies,  colonoscopies, but no clear source for these symptoms has been found.  He switched physicians to Dr. Russella Dar a few years back because Dr.  Sherin Quarry was not helping him to solve this problem with these bouts with  pain and nausea, vomiting.  Levsin had been added by Dr. Russella Dar and this  seemed to calm the bouts pretty well.  However, he was seen on August 28, 2008, at Dr. Ardell Isaacs office by Pollyann Kennedy, physician assistant.  The patient was complaining of abdominal bloating and discomfort but was  not having any nausea, vomiting.  He had had small bowel movements.  He  was set up for lab tests, x-ray, and was given a small prescription for  Vicodin and was told to restart sublingual Levsin.  Since that time, the  patient has continued to have this  bloating, abdominal discomfort.  The  pain is in the very low abdomen, practically in the pubic region.  He  does describe some urinary frequency and also urgency to have bowel  movements, but he is only passing small amounts of watery stool.  There  is no blood in the stool.  There is no melena.  Because of ongoing  symptoms, the patient had a CT scan, which by the way is probably at  least the seventh or eighth that he has had in his lifetime for  abdominal pain, this was performed today.  The CT scan is negative as  were the comprehensive metabolic profile, CBC, and lipase that were  performed last week.  However, the pain is such that the patient feels  that he cannot go back home and is being admitted for further workup.   The patient now gives a history of having received a single  intramuscular shot of antibiotics about a month ago at his physician  office when he had a spider bite.  The patient  denies fever, chills.  He  has had anorexia.  The stools that he has been having are quite small in  volume.  He did have a larger amount of stool output after drinking the  oral contrast for the CT scan today.  Pain is slightly worse if he moves  around.  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   PAST MEDICAL HISTORY:  1. Fatty liver with borderline splenomegaly observed on ultrasound and      CT scans in 2004.  He does not have any splenomegaly or fatty liver      showing up on a CT scan today.  2. Type 2 diabetes mellitus.  3. Biliary pancreatitis in 2001.  4. Status post laparoscopic cholecystectomy in 2001.  5. Dyslipidemia.  6. Hypertension.  7. Sullivan Lone syndrome.  8. Pneumonia.  9. Diverticulosis.  10.Adenomatous colon polyps.  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  12.Previous EGD, colons, etc.  These are in no particular chronologic      order.  The patient has had an MR angiogram of the abdomen in       September 2003, study was normal.  He had an MRCP in February 2004,      this study was negative.  Both of these above studies were done      because of unexplained abdominal pain.  In 2007, he had colonoscopy      by Dr. Elnoria Howard revealing diverticulosis and some adenomatous colon      polyps were removed.  He has had an enteroscopy in November 2006 by      Dr. Russella Dar.  This study was normal.  In October 2006, he had an      upper endoscopy by Dr. Russella Dar, some gastritis was encountered and      some fundal gland as well as hyperplastic polyps were removed from      the stomach.  In September 2003, he had a colonoscopy by Dr. Roosvelt Harps to the cecum, this study was normal.   CURRENT MEDICATIONS:  1. Levsin 0.125 mg p.o. or sublingual 1 q.4-6 h. as needed.  2. Vicodin 5/500 one p.o. q.4-6 h. as needed.  3. Omeprazole 20 mg daily.  4. Simvastatin 40 mg daily.  5. TriCor 145 mg daily.  6. Lisinopril/hydrochlorothiazide 10/12.5 mg once daily.  7. Glucophage 500 mg twice daily.  8. Aspirin 81 mg daily.  9. Multivitamin once daily.  He has not taken any of his medicines today.   ALLERGIES:  PENICILLIN which caused hives in the past.   FAMILY HISTORY:  Father had heart disease.  There is no family history  of colon cancer or ulcer disease.   SOCIAL HISTORY:  The patient is married.  He lives in [place].  He is a Emergency planning/management officer for a [employer], maintenance ([employer]).  He does  not drink alcohol.  Does not consume tobacco.   REVIEW OF SYSTEMS:  CONSTITUTIONAL:  He has had stable weight.  NEUROLOGIC:  No headaches.  No dizziness.  No history of seizures.  PSYCHIATRIC:  No mood swings.  ENDOCRINE:  Sugars generally run about 98-  105.  He has not checked these in the last several days.  RESPIRATORY:  He denies shortness of breath or cough or pleuritic pain.  CARDIOVASCULAR:  No chest pain.  No chest pressure.  No palpitations.  ENT:  Denies dental disease, oral bleeding,  nosebleeds, and oral ulcers.  GI:  Reviewed above.  GU:  Has had some urinary frequency, some slight  diminishment in urination volume.  No dysuria.  The patient denies  increased abdominal girth.  DERMATOLOGIC:  The patient denies rash.  The  spider bite that he got has not proved to be any problem.   LABORATORY DATA:  From 2008-09-18, hemoglobin 16.1, hematocrit 45.4,  platelets 238,000, and white blood cell count 10.3.  MCV 90.  Potassium  3.8, sodium 137.  BUN 14, creatinine 1.1, glucose 113.  Total bilirubin  1.3, alkaline phosphatase 24, AST 20, ALT 30, and the lipase was 8.   IMAGING STUDIES:  1. Two-view abdomen of 18-Sep-2008, no acute findings, bowel gas      pattern unremarkable.  2. CT scan of the abdomen and pelvis today with contrast showed normal      study except for some sigmoid diverticulosis.   PHYSICAL EXAMINATION:  VITAL SIGNS:  Temperature 99.3, blood pressure  112/72, pulse 52, respirations 18.  GENERAL:  The patient is an overweight white male who seems to be in no  distress but is slightly anxious.  HEENT:  Sclerae are nonicteric.  Conjunctiva is pink.  Extraocular  movements are intact.  Ear, nose, and throat, the mucous membranes are  moist and clear.  Dentition is in good repair.  Pharynx is clear.  NECK:  No masses, no JVD.  LUNGS:  Clear to auscultation and percussion bilaterally.  There is no  cough, no shortness of breath.  CARDIOVASCULAR:  Regular rate and rhythm.  No murmurs, rubs, or gallops.  ABDOMEN:  Soft with minor tenderness in the very low mid abdomen, but  there is no guarding or rebound associated with this.  It is certainly  not an acute abdominal exam.  Bowel sounds are active.  There is no  bruits.  There is a slight umbilical hernia.  EXTREMITIES:  No cyanosis, clubbing, or edema.  The pedal pulses are 3+  bilaterally.  RECTAL:  There is scant amount of liquid brown stool which is fecal  occult blood negative.  NEUROLOGIC:  The  patient is slightly anxious, alert and oriented x3.  There is no tremor.  He is moving easily in the bed.   IMPRESSION:  1. Abdominal pain with slight loose stools, question whether this is      irritable bowel syndrome.  Given the dose of antibiotics given last      month when he got a spider bite, there is a low likelihood that      this could be pseudomembranous colitis, although the CT scan did      not show any colitis and his white blood cell count was normal last      week.  2. Non-insulin dependent diabetes mellitus.  3. Longstanding history of periodic bouts with abdominal pain and      nausea, vomiting for which no etiology was ever determined.  4. History of colon polyps as well as gastric polyps.   PLAN:  The patient is admitted for observation to the GI Service.  We  will begin empiric Flagyl by mouth, support with IV fluids, but allow  clear liquids, control pain as needed with Dilaudid.  Nausea, if it  occurs with p.r.n. Phenergan.      Jennye Moccasin, PA-C      Rachael Fee, MD  Electronically Signed    SG/MEDQ  D:  09/02/2008  T:  09/03/2008  Job:  [id number]

## 2010-06-08 NOTE — Assessment & Plan Note (Signed)
[facility]                         GASTROENTEROLOGY OFFICE NOTE   CORTAVIOUS, NIX                     MRN:          [id number]  DATE:03/14/2007                            DOB:          [birth year]    This is a return office visit for GERD and intestinal gas.  He notes  that he has had a slight increase in intestinal gas since beginning a  high fiber diet.  His reflux symptoms are under excellent control on  Protonix 40 mg daily.  He has no dysphagia, odynophagia, change in bowel  habits, melena, or hematochezia.  We have not yet received records from  Dr. Haywood Pao office regarding his last colonoscopy.   Current medications listed on the chart, updated, and reviewed.   MEDICATION ALLERGIES:  PENICILLIN.   EXAM:  In no acute distress.  Weight 198 pounds, blood pressure 112/72, pulse 68 and regular.  CHEST:  Clear to auscultation bilaterally.  CARDIAC:  Regular rate and rhythm without murmurs appreciated.  ABDOMEN:  Soft and nontender.  Normoactive bowel sounds.  He has a  small, easily reducible umbilical hernia.   ASSESSMENT AND PLAN:  1. Gastroesophageal reflux disease and intestinal gas.  Change to      omeprazole 40 mg p.o. q. a.m. for cost reasons.  Continue all      standard antireflux measures.  I have asked him to adjust some of      the high fiber foods in his diet to see if this will improve his      intestinal gas.  Otherwise, he may use Gas-X q.i.d. p.r.n.  I have      given him instructions on a low-gas diet.  2. Personal history of adenomatous colon polyps.  Await records from      Dr. Haywood Pao office to determine the followup interval for      surveillance colonoscopy.     Venita Lick. Russella Dar, MD, [facility]  Electronically Signed    MTS/MedQ  DD: 03/14/2007  DT: 03/15/2007  Job #: [id number]

## 2010-06-11 NOTE — Discharge Summary (Signed)
NAME:  Pedro [name], Pedro [name]                        ACCOUNT NO.:  [id number]   MEDICAL RECORD NO.:  [id number]                   PATIENT TYPE:  OBV   LOCATION:  5731                                 FACILITY:  [facility]   PHYSICIAN:  Jonna L. Robb Matar, M.D.            DATE OF BIRTH:  June 24, 1955   DATE OF ADMISSION:  08/08/2003  DATE OF DISCHARGE:  08/10/2003                                 DISCHARGE SUMMARY   PRIMARY CARE PHYSICIAN:  Duffy Rhody C. Andrey Campanile, M.D.   FINAL DIAGNOSES:  1. Atypical left lower lobe pneumonia with fevers, myalgias and diarrhea.  2. Hypertension.  3. Gastroesophageal reflux disease.   ALLERGIES:  PENICILLIN.   CODE STATUS:  Full.   HISTORY OF PRESENT ILLNESS:  This 55 year old white male had an acute  illness characterized with severe myalgias, fever, shortness of breath,  anorexia, diarrhea.  His background is hypertension and GERD.   PHYSICAL EXAMINATION:  VITAL SIGNS:  Temperature 102.6, respiratory rate 24.  EXTREMITIES:  Left lower extremity edema and generalized tenderness and  myalgias.   White count was 5.9.  Mild hyponatremia at 134.   Chest x-ray showed an early left lower lobe infiltrate.   HOSPITAL COURSE:  The patient was put on IV fluids, rehydrated, cultures  were negative and after 48 hours of Avelox, the patient was afebrile.  Myalgias and swelling have really cleared up.   DISPOSITION:  The patient will be discharged on Avelox 400 daily for a week.  He will continue his Norvasc 10, Lisinopril 10, Nexium 40.  Use Tylenol as  needed for the residual myalgias.  He is to be on lighter duty for a week  and only do short local hauls instead of long runs and be on low salt diet.                                                Jonna L. Robb Matar, M.D.   Dorna Bloom  D:  08/10/2003  T:  08/10/2003  Job:  [id number]   cc:   Duffy Rhody C. Andrey Campanile, M.D.  [address]  [place]  [place] [address]  Fax: [phone]

## 2010-06-11 NOTE — Discharge Summary (Signed)
[place]  Patient:    BERNADETTE, ARMIJO Visit Number: [id number] MRN: [id number]          Service Type: MED Location: [phone] Attending Physician:  Drema Halon Dictated by:   Vale Haven Andrey Campanile, M.D. Admit Date:  03/08/2001 Discharge Date: 03/10/2001                             Discharge Summary  HISTORY OF PRESENT ILLNESS:  The patient is a 55 year old white male admitted with less than a 12-hour history of acute onset of nausea, vomiting, diarrhea, and abdominal discomfort.  He had a past history of cholelithiasis with a cholecystectomy and he has had several visits to the emergency room with abdominal symptoms of which no work-up has proved to be positive.  He had a little bit of distended abdomen, a little bit of rebound, and a fetid-smelling breath and he was admitted to rule out small bowel obstruction.  The past medical history, review of systems, and physical exam will not be redictated as these have not returned to the chart.  HOSPITAL COURSE:  The patient was admitted and seen in consultation by gastrointestinal and surgery.  Consultants agreed with ruling out small bowel obstruction.  A CT scan of the abdomen was negative.  His rectal exam was normal.  Lipase, amylase, and CBC were all normal.  Porphobilinogens for 24-hour urine were collected by the time of discharge.  This will not be returned and will follow-up with GI.  The patients potassium was a little borderline on admission at 3.3, but this went to normal.  There were really no focal abnormalities and after 24 hours, his abdominal exam was improving.  He had stopped vomiting 18 hours after the onset of vomiting.  By the morning of discharge, there was no tenderness in the abdomen.  He was taking liquids, felt better, and had been out of bed without problems.  His abdominal exam was benign without rebound or focal tenderness.  Bowel sounds were normal  and active.  Because this was the third episode in a year of apparently similar things which he went to the hospital for, it was felt that perhaps upper and lower endoscopy and follow-up with GI would be a good idea.  He has seen Genene Churn. Sherin Quarry, M.D., before.  He was discharged without complications.  DISCHARGE DIAGNOSIS:  Viral syndrome with abdominal pain.  DISCHARGE MEDICATIONS:  Resume his Prilosec for reflux.  DIET:  Clear liquids.  Advance as able.  No dairy.  FOLLOW-UP:  Follow up at the office in one week to follow up GI wise.  COMPLICATIONS:  None.  CONDITION ON DISCHARGE:  Improved. Dictated by:   Vale Haven Andrey Campanile, M.D. Attending Physician:  Drema Halon DD:  03/10/01 TD:  03/10/01 Job: [id number]

## 2010-06-11 NOTE — H&P (Signed)
NAME:  Pedro [name], Pedro [name]                        ACCOUNT NO.:  [id number]   MEDICAL RECORD NO.:  [id number]                   PATIENT TYPE:  INP   LOCATION:  5714                                 FACILITY:  [facility]   PHYSICIAN:  Genene Churn. Sherin Quarry, M.D.             DATE OF BIRTH:  [birth year]   DATE OF ADMISSION:  09/28/2001  DATE OF DISCHARGE:                                HISTORY & PHYSICAL   HISTORY OF PRESENT ILLNESS:  Mr. Venard is a 55 year old white male who I  have seen since last February. He has a history of intermittent nausea and  vomiting and abdominal pain that dates back thirteen years. Initially, these  were episodes that could last several days but occurred less than once a  year. In the last few years, the frequency has increased and he has had  several of these in the last week. His current episode began probably four  days ago with epigastric bloating and pain, leading to nausea and vomiting.  He was seen in my office by my physician assistant, who followed my  recommendations and tried him on IM Imitrex, which did not help. He then got  Demerol and Vistaril and went home and within 12 hours, was feeling much  better. However, last night, he awakened at about 2:00 a.m. with recurrence  of his epigastric pain and bloating and since 4:00 a.m., has vomited four or  five times. He has not had a bowel movement since yesterday, which was  fairly normal. There have been no chills or fever in the last 24 hours, but  he did have a low grade fever, apparently when seen in my office last  Wednesday. He had a similar but less prolonged episode about one month ago,  when he was seen by one of my partners, who started him on Maxalt, which he  never took. The reason for the Imitrex and Maxalt was to rule out  intestinal migraines. This appears to have been adequately done. His workup  has been quite extensive. He was apparently endoscoped in the late 1990's in  Dr. Blossom Hoops office.  However, I do not have details of that. When I saw him  for the first time in February, he had a three way abdomen that suggested an  early ileus but within 24 hours, a CT scan of his abdomen was entirely  normal. He subsequently underwent an upper GI with small bowel follow  through to rule out  Crohn's disease and that was also normal. He has been  seen in the emergency room on numerous occasions with this episodic pain and  each time, he has had normal labs. However, in September of 2001, an  ultrasound suggested sludge in his gallbladder and because he had had  recurrent symptoms, he underwent a laparoscopic cholecystectomy.  Unfortunately, his has not helped his symptoms at all. He has been in  generally good health. He  does not smoke or drink or take nonsteroidal drugs  and has been prophylactically taking Prilosec. Therefore, the etiology of  these symptoms remains unclear to me.   ALLERGIES:  PENICILLIN.   SOCIAL HISTORY:  No smoking. No alcohol. Caffeine occasionally. He is a  native of [place] who has lived in [place] since the age of 5 years. He  is currently self-employed as a Engineer, maintenance. He has been married  since 19.   CURRENT MEDICATIONS:  Prilosec 20 mg every morning.   PAST SURGICAL HISTORY:  Teeth extraction and laparoscopic cholecystectomy.   PAST MEDICAL HISTORY:  Fractured color bone, left arm and right ankle and a  fall on his back in the 1980's.   FAMILY HISTORY:  Father died at age 109 years of an myocardial infarction.  Mother is alive at age 48 years with chronic obstructive pulmonary disease  and peptic ulcer disease. He has no brothers. Has four sisters, all alive,  although one had an myocardial infarction in her 76's and two suffer from  asthma. He has two children who are alive and well.   PHYSICAL EXAMINATION:  VITAL SIGNS: Are as in the emergency room and look  okay.  HEENT: Negative.  NECK: Somewhat supple but there is some  tenderness in the strap muscles in  the back.  CHEST: Clear.  HEART: Tones are normal without extra sounds or murmurs.  ABDOMEN: Distended and minimally tender everywhere with maybe a little more  tenderness in the left lower quadrant but no masses or organomegaly. Bowel  sounds are diminished and there are no bruits.  EXTREMITIES: Showed no clubbing, cyanosis, or edema.  NEURO: Examination is grossly normal.   LABORATORY DATA:  Normal CBC, C-met, amylase, and lipase.   DIAGNOSTIC STUDIES:  EKG and chest x-ray are okay and a KUB again suggests a  possible early ileus.   IMPRESSION:  Recurrent abdominal pain with nausea and vomiting, etiology  unclear. 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 Ischemic bowel and  inflammatory bowel disease need to be ruled out.   PLAN:  The patient is to undergo an upper endoscopy later today.  Assuming  that that is negative, he will have an MRI/MRA over the weekend. If that is  negative, he will have a colonoscopy. And if all of this negative, we are  collecting a 24 hour urine for porphobilinogen. But he may need further  evaluation at a Medical Center.                                                 Genene Churn. Sherin Quarry, M.D.    JMW/MEDQ  D:  09/28/2001  T:  09/29/2001  Job:  [id number]   cc:   Vale Haven. Andrey Campanile, M.D.

## 2010-06-11 NOTE — Consult Note (Signed)
Pedro [name]. [facility]  Patient:    Pedro [name], Pedro [name] Visit Number: [id number] MRN: [id number]          Service Type: MED Location: [phone] Attending Physician:  Drema Halon Dictated by:   Raynelle Jan, M.D. Proc. Date: 03/08/01 Admit Date:  03/08/2001   CC:         Duffy Rhody C. Andrey Campanile, M.D.  Genene Churn. Sherin Quarry, M.D.   Consultation Report  DATE OF BIRTH:  March 08, 1956.  ATTENDING PHYSICIAN:  Duffy Rhody C. Andrey Campanile, M.D.  CHIEF COMPLAINT:  Nausea, vomiting, and abdominal pain.  HISTORY OF PRESENT ILLNESS:  This is a 55 year old male who presents to [facility] Emergency Room with a one-day history of persistent nausea, vomiting, associated with mild to moderate abdominal pain "abdominal bloating."  He denies any constipation; however, the pain has been associated with an intermittent watery diarrhea.  He denies melena, bright red blood per rectum, or mucus in his stools.  He states that these symptoms seem to have been a recurrent problem dating back almost 12 years; however, the episodes seem to be more frequent and severe in nature.  Currently, he is having three to four episodes a year.  Usually, all of the episodes seem to be similar in that they involve nausea, vomiting, and abdominal pain usually lasting 12 to 24 hours maximum.  Apparently, he has been worked up in the past and underwent an upper endoscopy approximately two years ago that, according to the patient, was unremarkable.  PAST MEDICAL HISTORY:  Significant for a hiatal hernia and GERD.  Mild pancreatitis that questionably gallstone related in September 2002.  He is status post cholecystectomy.  MEDICATIONS:  Daily proton pump inhibitor and occasionally uses Advil.  ALLERGIES:  No known drug allergies.  PAST SURGICAL HISTORY:  He is status post cholecystectomy.  FAMILY HISTORY:  His father died of a MI in his 77s.  There is no family history  of GI malignancy, peptic ulcer disease, or inflammatory bowel diseases.  SOCIAL HISTORY:  He is married.  Works as a Theatre stage manager.  Denies alcohol, tobacco, or drug use.  REVIEW OF SYSTEMS:  His weight has been stable.  His appetite normal. Otherwise his review of systems is negative except for the HPI x 10.  PHYSICAL EXAMINATION:  VITAL SIGNS:  Vitals are stable.  GENERAL:  He is alert, oriented, and in no acute distress.  HEENT:  No scleral icterus.  Mucus membranes are moist and pink.  NECK:  Supple without JVD or lymphadenopathy.  CARDIOVASCULAR:  Regular rate and rhythm.  No murmurs, rubs, or gallops.  LUNGS:  Significant for some faint bibasilar crackles.  Otherwise negative.  ABDOMEN:  Soft with voluntary guarding but no rebound or masses.  Mild distention.  Positive bowel sounds.  No fluid wave.  EXTREMITIES:  No clubbing, cyanosis, or edema.  SKIN:  No rashes.  LABORATORY DATA:  CBC normal.  CMP shows a low potassium of 3.3, normal LFTs, albumin slightly low at 3.4, lipase is 35.  Acute abdominal series on admission showed constipation at the right transverse colon.  No free air. The chest was clear.  ASSESSMENT/PLAN:  This is a 55 year old male with recurrent nausea, vomiting, and abdominal pain of uncertain etiology.  Differential diagnoses include a partial small-bowel obstruction, Crohns porphyria, angioedema, irritable bowel syndrome, and others.  RECOMMENDATIONS:  Agree with CT scan once nausea and vomiting are relieved. Would consider NG tube placement for comfort and  decompression if symptoms persist.  Recommend if CT is normal, proceeding with an upper GI series with small bowel follow-through.  We will obtain 24-hour urine, porphobilinogen to assess for possible porphyria.  We will attempt to obtain previous workup.  We will follow this interesting case with you.  Thank you for the consult.Dictated by:   Raynelle Jan, M.D. Attending Physician:   Drema Halon DD:  03/09/01 TD:  03/09/01 Job: [id number]

## 2010-06-11 NOTE — Consult Note (Signed)
[place]. [facility]  Patient:    TEE, RICHESON Visit Number: [id number] MRN: [id number]          Service Type: MED Location: [phone] Attending Physician:  Drema Halon Dictated by:   Adolph Pollack, M.D. Admit Date:  03/08/2001   CC:         Duffy Rhody C. Andrey Campanile, M.D.   Consultation Report  REQUESTING PHYSICIAN:  Duffy Rhody C. Andrey Campanile, M.D.  REASON FOR CONSULTATION:  Abdominal pain, nausea and vomiting.  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 He belched and began having nausea and vomiting. He said he vomited large amounts of food material, yellowish-greenish, and greenish material. He states it began to smell feculent. He could not stop vomiting, and each time he vomited he had worsening of his abdominal pain. He had had a normal bowel movement yesterday morning and some loose bowel movements prior to the episode. He states he has had multiple episodes like this in the past but nothing quite this severe. Approximately a year and a half ago, he had some mild pancreatitis which was felt to be biliary in origin, and he underwent a laparoscopic cholecystectomy. He has not had pancreatitis since then, but he continues to have these type of episodes. No fevers or chills.  PAST MEDICAL HISTORY: 1. Gastroesophageal reflux disease. 2. Pancreatitis. 3. Hiatal hernia.  PREVIOUS OPERATIONS:  Laparoscopic cholecystectomy September 2001.  ALLERGIES:  PENICILLIN causes hives.  MEDICATIONS:  Prilosec.  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 He denies tobacco, alcohol or illicit drugs.  REVIEW OF SYSTEMS:  GI:  He denies any hepatitis, peptic ulcer disease, diverticulitis, or colitis. GU:  He denies any kidney stones or urinary tract infections.  PHYSICAL  EXAMINATION:  GENERAL:  A stout male in no acute distress but appears uncomfortable.  HEENT:  Eyes:  Extraocular movements are intact. Sclerae are clear.  VITAL SIGNS:  Temperature is currently 98.6 with a blood pressure 140/90, pulse of 76.  ABDOMEN:  Slightly firm with some distention and tympany. He has mild diffuse tenderness. Active bowel sounds are noted. No hernias in the umbilical area or the groin area. No palpable masses or organomegaly.  LABORATORY AND ACCESSORY DATA:  White blood cell count is 6,100. No left shift. Hemoglobin 15.9, potassium 3.3, albumin 3.4. Other liver function and tests within normal limits. Amylase/lipase normal.  Abdominal x-rays:  No free air. No dilated small bowel loops. There is gas throughout the colon. There is some fecal material noted in the colon.  IMPRESSION:  Abdominal distention with nausea and vomiting. Etiology unclear at this time. Currently there is no hard evidence for small bowel obstruction although this remains the differential. Possibility of a very severe gastroenteritis or severe gastritis, as what he is vomiting now is mostly yellowish-type material.  RECOMMENDATIONS:  Aggressive hydration as is being done and treat the nausea and vomiting. If we can get the nausea under control, then I would suggest a CT scan with full oral contrast for further evaluation of a possible partial or complete bowel obstruction. If this is unremarkable, then I think gastroenterology consultation would be warranted. Dictated by:   Adolph Pollack, M.D. Attending Physician:  Drema Halon DD:  03/08/01 TD:  03/08/01 Job: [id number]

## 2010-06-11 NOTE — Consult Note (Signed)
Pedro [name]. [facility]  Patient:    Pedro [name], Pedro [name]                     MRN: [id number] Adm. Date:  [id number] Attending:  Drema [name] Dictator:   Pedro [name], M.D. CC:         Pedro [name]. Pedro [name], M.D.   Consultation Report  REASON FOR CONSULTATION:  Pancreatitis.  IMPRESSION:  Recurrent pancreatitis with small bowel ileus.  PLAN: 1. Ultrasound. 2. Bowel rest. 3. IV proton pump inhibitor. 4. Fasting lipid profile in a.m.  HISTORY OF PRESENT ILLNESS:  Pedro [name] is a 55 year old white male with no significant past medical history. He reports to the emergency department today following a visit to his primary care physician. He reports that over the last year and a half he has had recurrent bouts of abdominal pain, nausea, vomiting that resolved after pain medication and maintaining a bland diet for 4 or 5 days. He has had approximately three to four episodes. The last being in early August. On Monday night, he began belching and had mild abdominal pain. These are the symptoms that the other illnesses have been proceeded by. He came to the emergency department and was found to have a focal ileus with a lipase of 25. He was treated symptomatically and sent home. He got good pain relief with the pain medication; but after 24 hours, the pain began to worsen. He has had nausea and vomiting with the last solid p.o. intake being on Monday. He reports three bowel movements yesterday and one bowel movement this morning. He reports that the bowel movements have been very small and black. The patient also reports abdominal distention during this period. The patient reports the abdominal pain is lower bilateral quadrant pain that radiates to his sides and back.  PAST MEDICAL HISTORY:  Hiatal hernia.  PAST SURGICAL HISTORY:  None.  CURRENT MEDICATIONS:  Prevacid 15 mg p.r.n.  ALLERGIES:  PENICILLIN - hives.  SOCIAL HISTORY:  The patient is  married with two children. He lives in [place]. He does not smoke or drink. 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  FAMILY HISTORY:  Significant history of cancer of unknown type.  PHYSICAL EXAMINATION:  VITAL SIGNS:  Temperature 97.4, pulse 80, respirations 18, blood pressure 130/80.  GENERAL:  He is a well-nourished, well-developed, white male in no apparent distress.  HEENT:  Pupils are equal, round, and reactive to light. Sclerae nonicteric. Conjunctivae not injected. Oropharynx [name].  NECK:  No JVD.  HEART:  Regular rate and rhythm without murmurs, rubs, or gallops.  LUNGS:  [name] to auscultation, except for ______ rales in the left lower posterior base.  ABDOMEN:  Distended, tender diffusely. Bowel sounds active. No rebound.  RECTAL:  Per EDP, negative, nontender.  EXTREMITIES:  Pulses are 2+. No edema.  LABORATORY DATA:  EKG:  Sinus bradycardia, slight rightward axis, inverted Ts in 1.  White blood cell count 4.5, hemoglobin 16.5, MCV 87.9, platelet count 264, ANC 2.4. Sodium 138, potassium 4.6, chloride 106, carbon dioxide 25, glucose 106, BUN 12, creatinine 1.1, calcium 9.1, total protein 6.5, albumin 3.6, AST 45, ALT 36, alkaline phosphatase 38, total bilirubin 2.1, lipase 111, amylase is 156. Urinalysis negative.  CT scan:  No focal pancreatitis or mass. No gallstones. Few sigmoid tics.DD: 09/30/99 TD:  09/30/99 Job: [id number]

## 2010-06-11 NOTE — Discharge Summary (Signed)
   NAME:  Pedro [name], Pedro [name]                        ACCOUNT NO.:  [id number]   MEDICAL RECORD NO.:  [id number]                   PATIENT TYPE:  INP   LOCATION:  6525                                 FACILITY:  [facility]   PHYSICIAN:  Duke Salvia, M.D. [facility]           DATE OF BIRTH:  February 13, 1955   DATE OF ADMISSION:  03/12/2002  DATE OF DISCHARGE:  03/13/2002                                 DISCHARGE SUMMARY   PRIMARY DISCHARGE DIAGNOSIS:  Atypical chest pain.   SECONDARY DIAGNOSES:  1. Gilbert's syndrome.  2. Hiatal hernia.  3. Status post cholecystectomy.   HISTORY OF PRESENT ILLNESS:  This is a 55 year old gentleman with no known  history of coronary disease with gradual onset of chest pain, abdominal  pain, and headache with associated dizziness and shortness of breath.  The  patient reports onset while sleeping in bed, describes chest pain as a  tightness, nonradiating, not relieved by nitroglycerin.  The patient's pain  is mostly relieved with Dilaudid in the emergency room.  The patient  describes presyncopal episode.  His daughter found him on the floor awake,  but not alert.  No head trauma.  The patient has had two prior episodes of  chest pain December 2003.  No known coronary disease.  Past admissions for  chest pain, nausea, and vomiting.  The patient had undergone a CT scan which  was negative, and MRI and MRA negative, normal upper GI scan, a bowel follow-  through, negative colonoscopy.  The patient was admitted and seen by Dr.  Graciela Husbands.  He was then taken to the catheterization laboratory to rule out  angina with atypical/typical chest pain.  Cardiac catheterization showed  normal coronaries with an EF of 60%.  The patient had a consult with GI  services.  He underwent a CT scan of the chest to rule out PE, which was  negative, and also had an ultrasound of his abdomen.  Dr. Delena Serve from GI  saw the patient with [facility].  The patient was to follow with  Dr.  Sherin Quarry to  decide if an EGD or upper GI was  needed.  He was then discharged to home on  his previous medication of Nexium 40 a day, Tylenol 1-2 tablets every 4-6  hours as needed, low-fat, low-cholesterol diet and to follow with Dr.  Sherin Quarry.     Chinita Pester, C.R.N.P. [facility]                 Duke Salvia, M.D. [facility]    DS/MEDQ  D:  03/13/2002  T:  03/13/2002  Job:  [id number]   cc:   Tasia Catchings, M.D.  [address]  [address]  [place]  [place] [address]  Fax: [phone]   Vale Haven. Andrey Campanile, M.D.  [address]  [place]  [place] [address]  Fax: [phone]

## 2010-06-11 NOTE — Cardiovascular Report (Signed)
NAME:  Pedro [name], Pedro [name]                        ACCOUNT NO.:  [id number]   MEDICAL RECORD NO.:  [id number]                   PATIENT TYPE:  INP   LOCATION:  6525                                 FACILITY:  [facility]   PHYSICIAN:  Charlies Constable, M.D. [facility]              DATE OF BIRTH:  07-02-1955   DATE OF PROCEDURE:  03/12/2002  DATE OF DISCHARGE:                              CARDIAC CATHETERIZATION   CLINICAL HISTORY:  The patient is 55 years old and has no prior history of  known heart disease, although he does have positive risk factors.  He  presented to the emergency room with chest pain, which was ongoing and was  seen by Dr. Graciela Husbands. His ECG showed slight inferior ST-T changes and he was  brought to the lab urgently for evaluation.  He has a history of Guillain-  Barre syndrome and had an elevated bilirubin and SGOT.   PROCEDURES PERFORMED:  1. Left heart catheterization.  2. Selective coronary angiography.  3. Aortography.   DESCRIPTION OF PROCEDURE:  The procedure was performed via the right femoral  artery using an arterial sheath and 6 French preformed coronary catheters.  A front wall arterial puncture was performed and Omnipaque contrast was  used. An aortic root injection was performed to rule out an aortic  dissection. The right femoral artery was closed with Perclose at the end of  the procedure.  He tolerated the procedure well and left the laboratory in  satisfactory condition.   RESULTS:  1. The left main coronary artery:  The left main coronary artery was free of     significant disease.  2. Left anterior descending:  The left anterior descending artery gave rise     to a large diagonal branch and three septal perforators. These and the     LAD proper were free of significant disease.  3. Circumflex artery:  The circumflex artery gave rise to an intermediate     branch, a marginal branch and a posterolateral branch.  These vessels     were free of significant  disease.  4. Right coronary artery:  The right coronary is a moderate sized vessel     that gave rise to a conus branch, a right ventricular branch, a posterior     descending branch, and posterolateral branch.  These vessels were free of     significant disease.   LEFT VENTRICULOGRAM:  The left ventriculogram performed in the RAO  projection showed good wall motion with no areas of hypokinesis. The  estimated ejection fraction was 60%.   An aortic root injection showed no evidence of aortic dissection and no  aortic regurgitation.   The aortic pressure was 111/69 with a mean of 88 and left ventricular  pressure was 111/12.   CONCLUSION:  Normal coronary angiography and left ventricular wall motion.    RECOMMENDATIONS:  There does not appear to be any source of  ischemia.  The  etiology of the patient's symptoms are not clear.  He does have elevated  liver function test.  We will obtain a GI consult and also obtain ultrasound  of the liver, gallbladder and pancreas, as well as an amylase and lipase.                                                 Charlies Constable, M.D. [facility]    BB/MEDQ  D:  03/12/2002  T:  03/13/2002  Job:  [id number]   cc:   Vale Haven. Andrey Campanile, M.D.  [address]  [place]  [place] [address]  Fax: [phone]   Duke Salvia, M.D. [facility]   Cardiopulmonary Laboratory

## 2010-06-11 NOTE — Discharge Summary (Signed)
NAME:  Pedro [name], Pedro [name]                        ACCOUNT NO.:  [id number]   MEDICAL RECORD NO.:  [id number]                   PATIENT TYPE:  INP   LOCATION:  5714                                 FACILITY:  [facility]   PHYSICIAN:  Genene Churn. Sherin Quarry, M.D.             DATE OF BIRTH:  1955-02-19   DATE OF ADMISSION:  09/28/2001  DATE OF DISCHARGE:  09/30/2001                                 DISCHARGE SUMMARY   DISCHARGE DIAGNOSIS:  Abdominal pain with nausea and vomiting consistent  with early partial small bowel obstruction, but underlying etiology unclear.   DISCHARGE MEDICATIONS:  Prilosec 20 mg each morning.   FOLLOW UP:  The patient is to see me in my office later this week.   CONDITION ON DISCHARGE:  Improved.   DIET:  Regular.   INDICATIONS FOR PROCEDURE:  The patient is a frustrating problem.  He is 55  years old and has had a 13-year history of intermittent episodes of nausea,  vomiting, and abdominal pain which initially were quite infrequent, but now  are becoming more and more frequent.  Workup prior to admission had included  a laparoscopic cholecystectomy, a remote endoscopy, a normal upper GI with  small bowel follow through, and a negative CT scan.  Although, on several  occasions when seen in the emergency room, a two-way abdominal series  suggested early partial small bowel obstruction.  Laboratory evaluation  during his episodes have always been normal.   He was admitted on September 5 with three episodes within the last two  weeks, the last of which began about 12 hours prior to admission.  Each  episode occurs fairly similarly with epigastric bloating, abdominal pain  diffusely, and nausea and vomiting.  He had been given empirically a trial  of a Maxalt and Imitrex in case this was abdominal angina or abdominal  migraines, and neither of which seemed to work.  Because of continued  symptoms, he was hospitalized.   PHYSICAL EXAMINATION:  GENERAL:  The patient was  mildly obese.  ABDOMEN:  Slightly distended and diffusely tender, perhaps a little more so  in the left lower quadrant, without a mass, without organomegaly, and with  normal bowel sounds and no bruits.   LABORATORY DATA:  Initially his CBC was normal with a white count of 5.7,  hemoglobin of 15.9, and a CMET was normal with the exception of bilirubin of  2.0 mostly indirect thought to be secondary to Gilbert's syndrome.  Subsequent laboratory data including amylase and lipase were normal.  A 24-  hour urine for porphyrobilinogen was still pending at the time of discharge.   HOSPITAL COURSE:  The patient was placed at bed rest and on IV fluids.  On  the day of admission, he underwent an upper endoscopy which was entirely  normal.  There was no evidence of gastric outlet obstruction, inflammation,  or other abnormalities.  The scope was passed  into the second portion of the  duodenum which appeared to be normal.   Subsequently he had an MRI/MRA which was likewise normal.  There was no  evidence of ischemic bowel and no evidence of small bowel obstruction.   Finally, after undergoing these two studies and obtaining pain relief and  nausea relief with Phenergan and Demerol, he was basically asymptomatic.  He  was therefore prepped with a lower colon examination and Dr. Luther Parody in my  absence performed that.  Dr. Luther Parody was able to get into the terminal  ileum and confirm that the terminal ileum and entire colon was also entirely  normal.  This completes a full evaluation of his anatomy.   The patient was discharged on September 7, with the full knowledge that we  had not come up with a diagnosis and with the likelihood of these symptoms  recurring. I am going to see him in my office and see whether I can arrange  for him to see someone at a tertiary medical center to see if they have any  other additional ideas.                                               Genene Churn. Sherin Quarry,  M.D.    JMW/MEDQ  D:  10/01/2001  T:  10/02/2001  Job:  [id number]   cc:   [place]. Andrey Campanile, M.D.

## 2010-06-11 NOTE — Assessment & Plan Note (Signed)
Pedro [name]                         GASTROENTEROLOGY OFFICE NOTE   RANON, COVEN                     MRN:          [id number]  DATE:05/22/2006                            DOB:          20-Feb-1955    Mr. Pedro [name] has had excellent control of his intermittent abdominal  pain, bloating, nausea and vomiting.  His symptoms respond well to the  p.r.n. use of Levsin and Gas-X.  He remains on Protonix 40 mg q. day for  control of GERD.  He states he underwent a colonoscopy by Dr. Elnoria Howard in  2007 for rectal bleeding, and polyps were noted.  He was recommended to  have a follow-up colonoscopy in 2010 and would like to have his follow-  up procedures and all of his GI care with me.  I did address his  fragmented gastrointestinal care over the past few years, as he has seen  Dr. Ewing Schlein, Dr. Luther Parody, Dr. Sherin Quarry, Dr. Elnoria Howard and myself.  He also  has noted worsening problems with peri-umbilical pain and buldging.  He  has lost approximately 30 pounds over the past several months, since he  was diagnosed with type 2 diabetes mellitus.  He has no dysphagia,  odynophagia, melena, hematochezia or change in bowel habits.   CURRENT MEDICATIONS:  Listed on the chart updated and reviewed.   MEDICATION ALLERGIES:  PENICILLIN.   EXAMINATION:  GENERAL:  No acute distress.  VITAL SIGNS:  Weight 192.4 pounds, blood pressure 96/60, pulse 64 and  regular.  CHEST:  Clear to auscultation bilaterally.  CARDIAC:  Regular rate and rhythm without murmurs.  ABDOMEN:  Soft, nontender, nondistended, normoactive bowel sounds.  No  palpable organomegaly or masses.  He has a small, easily reducible  umbilical hernia.   ASSESSMENT/PLAN:  1. Gastroesophageal reflux disease. Abdominal pain. Bloating. Maintain      standard anti-reflux measures and Protonix 40 mg p.o. q.a.m.  May      use Tums, Gas-X and Levsin as needed.  2. Personal history of adenomatous colon polyps.  Recall  colonoscopy      in 2010.  Await records from Dr. Haywood Pao office, to determine the      date of followup and review the findings.     Venita Lick. Russella Dar, MD, [facility]  Electronically Signed    MTS/MedQ  DD: 05/22/2006  DT: 05/22/2006  Job #: [id number]   cc:   Olene Craven, M.D.

## 2010-06-11 NOTE — H&P (Signed)
[place]  Patient:    Pedro [name], Pedro [name] Visit Number: [id number] MRN: [id number]          Service Type: MED Location: [phone] Attending Physician:  Drema Halon Dictated by:   Pedro [name], M.D. Admit Date:  03/08/2001                           History and Physical  DATE OF BIRTH:  [birth year]  PRESENTING HISTORY:  This 55 year old white male went to the emergency room at 2:30 in the morning, the morning of admission.  He was well until 10 oclock the evening of admission, when he began feeling nauseated and started vomiting.  He had persistent vomiting and was seen by the emergency room physician with the above symptoms and a workup ______  no delineating cause. His CBC, amylase, lipase and CMET were normal.  Abdominal films showed no specific abnormality.  I was notified of his presence at 9:30 in the morning, when I went to see him and found that he had a fecal smell to his emesis.  He was admitted and surgeons were called and he was hydrated and given something for nausea and pain.  ALLERGIES:  PENICILLIN.  MEDICATIONS:  He has Prilosec on hand for a known hiatal hernia.  PAST MEDICAL HISTORY:  Hospitalizations/operations as relates to present illness.  The patient, a year and a half prior, had a cholecystectomy due to pancreatitis and cholelithiasis.  REVIEW OF SYSTEMS:  The patient had otherwise been working and feeling well. There are two other problems that will be addressed in followup, one is the question of sleep apnea, the other is he has had two episodes of syncope over the past few months for no apparent reason.  He has no neurological or visual complaints and has had no neurologic symptoms recently.  PHYSICAL EXAMINATION:  VITAL SIGNS:  Temperature 98, pulse 84, respirations of 16, blood pressure 130/80 without orthostasis.  SKIN:  Cool and dry with good turgor.  No acute rash.  HEENT:  Dry  tongue.  No neck masses.  Nose is clear.  CHEST:  Clear.  CARDIAC:  No murmurs, lifts or gallops.  ABDOMEN:  Soft, 1+ distention with 1+ tenderness and 1+ rebound, more to the right side of the abdomen.  There was no McBurneys point abnormality.  Bowel sounds were actually increased.  RECTAL:  Guaiac-negative stool.  NEUROMUSCULOSKELETAL:  Normal.  IMPRESSION ON ADMISSION: 1. Viral syndrome versus the partial small-bowel obstruction. 2. Known hiatal hernia.  PLAN:  Admit, hydrate, pain and nausea medicine.  The surgeon is to see the patient and we will also have gastroenterology evaluate. Dictated by:   Pedro [name], M.D. Attending Physician:  Drema Halon DD:  03/08/01 TD:  03/09/01 Job: [id number]

## 2010-06-11 NOTE — Consult Note (Signed)
   NAME:  Pedro [name], Pedro [name]                        ACCOUNT NO.:  [id number]   MEDICAL RECORD NO.:  [id number]                   PATIENT TYPE:  EMS   LOCATION:  MINO                                 FACILITY:  [facility]   PHYSICIAN:  Thornton Park. Daphine Deutscher, M.D.             DATE OF BIRTH:  1955-02-11   DATE OF CONSULTATION:  09/01/2001  DATE OF DISCHARGE:                           GENERAL SURGERY CONSULTATION   CHIEF COMPLAINT:  Abdominal pain since Friday.   HISTORY:  The patient is a 55 year old male, who had onset of abdominal pain  yesterday morning.  He has been worked up in the past for chronic abdominal  pain, and he states that he had his gallbladder removed for that.  The pain  got a little better and then got worse.  He presented to the ED and was seen  at 10:30 this morning, and discussions with primary care doctors were  engaged and based on an x-ray which showed a single air fluid level, the  patient was felt to have a surgical problem, and I was consulted by phone  about this patient.  A CT scan was obtained because he was having mid  epigastric pain and still has appendix.   The patient is in no acute distress.  EKG shows a normal sinus rhythm and  nonspecific T-wave abnormalities.  Next flat and upright x-rays showed a  single little dilated loop of small bowel.  CT scan as reviewed by me and  showed no dilated small intestine, no thickened small intestine, and normal-  appearing appendix; some stool was noted in the cecum.  No evidence of any  inflammatory masses at all.  The patient had normal lipase and amylase drawn  in the ED.   On my physical exam, the patient has no peritoneal signs, no rebound,  guarding.  I do not think the patient has a surgical abdomen.   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  I think that under the circumstances, the patient has  had a good  work-up, and there is no evidence of any acute abdominal problem and would  refer back to Dr. Sherin Quarry early next week for a follow-up.   IMPRESSION:  No acute surgical problem.  Recommend follow-up with Dr.  Sherin Quarry next week.                                               Thornton Park Daphine Deutscher, M.D.    MBM/MEDQ  D:  09/01/2001  T:  09/01/2001  Job:  [id number]   cc:   Genene Churn. Sherin Quarry, M.D.   Stanley C. Andrey Campanile, M.D.

## 2010-06-11 NOTE — Consult Note (Signed)
NAME:  Pedro [name], Pedro [name]                        ACCOUNT NO.:  [id number]   MEDICAL RECORD NO.:  [id number]                   PATIENT TYPE:  INP   LOCATION:  6525                                 FACILITY:  [facility]   PHYSICIAN:  Petra Kuba, M.D.                 DATE OF BIRTH:  1955-04-29   DATE OF CONSULTATION:  03/13/2002  DATE OF DISCHARGE:                                   CONSULTATION   REFERRING PHYSICIAN:  Dr. Piperton Bing.   HISTORY:  Patient well-known to Dr. Tasia Catchings with a long history of  periodic abdominal pain, with multiple nondiagnostic workups and a negative  evaluation at [facility], who has not had one of his abdominal attacks  since before this summer.  The current pain that woke him up at night that  accompanied shortness of breath and radiation to his arms that brought him  to the hospital is different than his usual pain.  He does take Nexium for  reflux and has not had any heartburn, reflux or swallowing problems.  He has  not been on any aspirin or nonsteroidals.  He did shovel some snow on Sunday  but was fine on Monday, but this pain woke him up.  According to his wife,  it does seem to hurt when he moves and is not related to eating.  He has no  other GI complaints and no lower bowel complaints.  His liver tests were  only very minimally elevated.  Amylase and lipase were fine.  CBC was fine.  Ultrasound showed a normal CBD and a CAT scan was negative for PE, as was a  catheterization negative as well and a chest x-ray.   PAST MEDICAL HISTORY:  His past medical history is pertinent for a  cholecystectomy for sludge, hiatal hernia, his of Gilbert's, but no other  obvious medical problems.   ALLERGIES:  Allergies are to PENICILLIN.   CURRENT MEDICINES:  Nexium only.   SOCIAL HISTORY:  He does not drink, smoke or use much over-the-counter  medicines.   FAMILY HISTORY:  Family history is negative for any obvious GI problems.   REVIEW OF  SYSTEMS:  Review of systems is negative per the wife, other than  the above.   PHYSICAL EXAMINATION:  The patient unfortunately did get some pain medicines  and is currently sleeping, therefore, not examined today.  Chart does say he  was better, however, the pain did seem to recur some when he got back from  his x-rays.   ASSESSMENT:  Atypical chest pain which actually sounds musculoskeletal after  my conversation with his wife.   PLAN:  We will recheck his liver tests today to make sure that they did not  bump up even further, particularly with his repeat pain.  Dr. Sherin Quarry is to  see tomorrow, if he is still here, to decide questionable repeat endoscopy,  upper GI  or even an MRCP.  Let me know if I can help today.  He certainly  could be discharged if stable and followed as an outpatient and Dr. Sherin Quarry  is happy to see back p.r.n., however, with his recurrent pain, probably need  to keep him at least one more day to decide any other workup and plans.                                               Petra Kuba, M.D.    MEM/MEDQ  D:  03/13/2002  T:  03/13/2002  Job:  [id number]   cc:   Petra Kuba, M.D.  [address], [address]  [place]  [place] [address]  Fax: [phone]   Vale Haven. Andrey Campanile, M.D.  [address]  [place]  [place] [address]  Fax: [phone]   Tasia Catchings, M.D.  [address]  [place]  [place] [address]  Fax: [phone]   [place] Bing, M.D. [facility]

## 2010-06-11 NOTE — H&P (Signed)
NAME:  Pedro [name], Pedro [name]                        ACCOUNT NO.:  [id number]   MEDICAL RECORD NO.:  [id number]                   PATIENT TYPE:  OBV   LOCATION:  1827                                 FACILITY:  [facility]   PHYSICIAN:  Renato Battles, M.D.                  DATE OF BIRTH:  February 19, 1955   DATE OF ADMISSION:  08/08/2003  DATE OF DISCHARGE:                                HISTORY & PHYSICAL   PRIMARY CARE PHYSICIAN:  Duffy Rhody C. Andrey Campanile, M.D.   REASON FOR ADMISSION:  Myalgia and fever.   HISTORY OF PRESENT ILLNESS:  The patient is a very pleasant 55 year old  white male who woke up on past Wednesday with generalized myalgia and some  fever.  These symptoms persisted over the last 48 hours.  The patient was  out of town and as soon as he came back home, his wife convinced him to come  to the emergency room.  Beside myalgia, the patient has had a fever, feels  very tired, has some shortness of breath  __________  with heat, no  coughing, no sneezing, no rhinorrhea, no nausea or vomiting.  Positive for  decreased appetite.  The patient denies any insect bites or tick bites.  The  only change the patient could identify in his routine life was the change of  the maker of some of his medications, as he reports.   REVIEW OF SYSTEMS:  CONSTITUTIONAL:  Positive for fever and decreased  appetite.  GI:  Positive for diarrhea.  No nausea, no vomiting, no  constipation.  CARDIOPULMONARY:  No cough, no sneeze, no rhinorrhea, no  chest pain.  GU:  No dysuria, hematuria, or retention.   PAST MEDICAL HISTORY:  1. Hypertension.  2. Hiatal hernia.  3. Gastroesophageal reflux disorder.   PAST SURGICAL HISTORY:  Cholecystectomy.   FAMILY HISTORY:  Positive for premature CAD.   SOCIAL HISTORY:  The patient is a Naval architect, married, denies tobacco,  alcohol, and drugs.   ALLERGIES:  PENICILLIN.   HOME MEDICATIONS:  Norvasc 10 mg p.o. every day, Lisinopril unknown dose,  Nexium.   PHYSICAL  EXAMINATION:  GENERAL:  The patient is alert, oriented x 3.  He is  in mild distress.  VITAL SIGNS:  Temperature is 102.6, heart rate is 98, respiratory rate 24,  blood pressure 142/64.  HEENT:  Head is atraumatic and normocephalic.  Pupils equal, round and  reactive to light and accommodation.  NECK:  No adenopathy, no thyromegaly, no JVD.  CHEST:  Clear to auscultation bilaterally.  No wheezing, no rales, no  rhonchi.  HEART:  Regular rate and rhythm.  ABDOMEN:  Soft, nontender, nondistended.  Normoactive bowel sounds.  EXTREMITIES:  No cyanosis or clubbing.  Positive for upper extremity edema,  left more than right.  MUSCULOSKELETAL:  The patient is tender to touch all over his body which was  surprisingly striking.   STUDIES:  CBC showed a normal white count, normal hemoglobin and platelets.  Electrolytes are all within normal limits.  The glucose was mildly elevated  at 110.  Liver functions showed low protein 5.9, low albumin of 3.3,  otherwise normal enzymes.   Chest x-ray showed a very questionable subtle left lower lobe infiltrate  versus atelectasis.   Head CT was negative.   ASSESSMENT/PLAN:  1. Hypertension.  Continue home medications.  2. Gastroesophageal reflux disease and hiatal hernia.  Continue Nexium.  3. Myalgia and fever.  This is still very poorly understood.  Going to check     a CPK, TSH, repeat chest x-ray, check urine drug screen.  Going to admit     the patient for observation at least for 24 hours, re-evaluated, and     continue to monitor.  4. Diarrhea.  Check stool for ova and parasite, also for white cells.  5. Mild hyperglycemia.  This most likely is nothing.  Going to check Accu-     Cheks q.a.c. and q.h.s. for the next 24 hours.  6. Family history of premature coronary artery disease.  Start the patient     on low dose aspirin.                                                Renato Battles, M.D.    SA/MEDQ  D:  08/09/2003  T:  08/09/2003   Job:  [id number]   cc:   Vale Haven. Andrey Campanile, M.D.  [address]  [place]  [place] [address]  Fax: [phone]

## 2010-06-11 NOTE — Discharge Summary (Signed)
NAME:  Pedro [name], Pedro [name] NO.:  [id number]   MEDICAL RECORD NO.:  [id number]          PATIENT TYPE:  INP   LOCATION:  5036                         FACILITY:  [facility]   PHYSICIAN:  Rachael Fee, MD   DATE OF BIRTH:  12-13-1955   DATE OF ADMISSION:  09/02/2008  DATE OF DISCHARGE:  09/04/2008                               DISCHARGE SUMMARY   DISPOSITION:  Home in stable condition.   DISCHARGE DIAGNOSES:  1. Lower abdominal pain, bloating, loose stool of unclear etiology.      Symptoms resolved with oral Flagyl.  It is possible the patient had      mild infectious colitis.  2. Several year history of nausea, vomiting, abdominal pain, worked up      extensively by different gastroenterologist including Dr. Ewing Schlein,      Dr. Luther Parody, Dr. Sherin Quarry, Dr. Elnoria Howard, [facility], and most      recently Dr. Russella Dar in our office.  Etiology of nausea, vomiting,      and abdominal pain, has never been determined.  3. Non-insulin-dependent diabetes.  4. History of colon polyps.  5. Dyslipidemia.  6. Hypertension.  7. Joubert syndrome.  8. Diverticulosis.   DISCHARGE MEDICATIONS:  Continue home medications including,  1. Levsin 0.125 mg every 4-6 hours as needed.  2. Vicodin 5/500 mg 1 every 4-6 hours as needed.  3. Omeprazole 20 mg daily.  4. Simvastatin 40 mg daily.  5. TriCor 145 mg daily.  6. Lisinopril/hydrochlorothiazide 10/12.5 mg daily.  7. Glucophage 500 mg twice daily.  8. Aspirin 81 mg daily.  9. Multiple vitamins daily.   CONSULTATIONS:  None requested this admission.   PROCEDURES:  None performed this admission.   HOSPITAL COURSE:  Pedro [name] is a 55 year old male with chronic  abdominal pain, nausea, and vomiting.  He was admitted, September 02, 2008,  for lower abdominal pain, loose stool.  He was not having his usual  nausea and vomiting.  The patient's symptoms began about 1 month after  receiving a shot of broad-spectrum antibiotics for spider bite.  The  patient was seen in our office for the abdominal bloating, lower  abdominal pain on August 28, 2008.  At that time, the patient had an  unremarkable comprehensive metabolic profile, CBC, and lipase.  A two-  view abdominal film was obtained and no acute findings were found.  The  patient's symptoms persisted; however, he was admitted to the hospital  for IV fluids, pain control, repeat labs, and empirical Flagyl.  On  admission, the patient's LFTs, amylase, lipase, and CBC were normal.  Urinalysis was normal.  He did have a low-grade temp on admission of  99.3, but that resolved several hours after admission.  Pedro [name] had  rapid improvement in his abdominal pain and loose stool.  Except at the  time of admission, the patient remained afebrile throughout his stay.  Of note, the patient was slightly bradycardic with his heart rate  fluctuating between 47 and 59.  No chest pain, dizziness, shortness of  breath.  On the day of discharge, Pedro [name] was tolerating  solids.  He denied any further abdominal pain or diarrhea.  He was discharged  home in stable condition with above medications.  He was given a  followup appointment with Dr. Russella Dar.   DISCHARGE DIAGNOSES:  1. Lower abdominal pain, loose stool.  Symptoms possibly secondary to      mild infectious colitis and resolved with oral Flagyl.  2. Chronic abdominal pain, nausea and vomiting worked up by multiple      gastroenterologists over the years.  Etiology of symptoms unknown.  3. History of adenomatous colon polyps.  4. Type 2 diabetes.  5. Dyslipidemia.  6. Hypertension.  7. Joubert syndrome.  8. Diverticulosis.      Willette Cluster, NP      Rachael Fee, MD  Electronically Signed    PG/MEDQ  D:  09/05/2008  T:  09/06/2008  Job:  [id number]

## 2010-06-11 NOTE — Op Note (Signed)
[place]  Patient:    Pedro [name], Pedro [name]                     MRN: [id number] Proc. Date: 10/01/99 Adm. Date:  [id number] Disc. Date: [id number] Attending:  Drema Halon                           Operative Report  PREOPERATIVE DIAGNOSIS:   Biliary pancreatitis.  POSTOPERATIVE DIAGNOSIS:  Biliary pancreatitis.  PROCEDURES:  Laparoscopic cholecystectomy with intraoperative cholangiogram.  SURGEON:  Catalina Lunger, M.D.  ASSESSMENT:  Adolph Pollack, M.D.  ANESTHESIA:  General.  DESCRIPTION OF PROCEDURE:  The patient was taken to the operating room and placed in a supine position after anesthesia was induced, using an endotracheal tube.  The abdomen was prepped and draped in a normal sterile fashion.  Using the transverse infraumbilical incision I dissected down to the fascia.  The fascia was opened vertically.  An 0 Vicryl pursestring suture was placed around the fascial defects.  Hasson trocar was placed in the abdomen. The abdomen was insufflated with carbon dioxide.  Under direct visualization a 10 mm port was placed in the subxiphoid region.  Two 5 mm ports were placed in in the right abdomen.  The gallbladder was identified, retracted cephalad.  A number of adhesions were taken out from the gallbladder.  Dissection began down on the infundibulum until the cystic duct could be identified.  It was dissected free of surrounding structures.  A small ductotomy was made in the anterior portion of the duct and a Reddick catheter placed through the 14 guage Angiocath and through the cystic duct.  Cholangiogram was performed which showed good filling of both proximal hepatic ducts as well as the common duct with good tapering and easy flow into the duodenum.  No evidence of filling defects.  The catheter was then removed.  The cystic duct was then doubly clipped and divided.  The cystic artery was dissected free, triply clipped  and divided.  The gallbladder was taken off the gallbladder bed using Bovie electrocautery and removed through the umbilical port.  Adequate hemostasis was ensured.  The fascial defect was closed with an 0 Vicryl pursestring suture.  The abdomen was allowed to deflate.  Skin incisions were closed with subcuticular 4-0 Vicryl.  Steri-Strips and sterile dressings were applied.  The patient tolerated the procedure well and went to PACU in good condition. DD:  10/01/99 TD:  10/04/99 Job: [id number]

## 2010-06-18 ENCOUNTER — Other Ambulatory Visit: Payer: Self-pay | Admitting: *Deleted

## 2010-06-18 MED ORDER — GLUCOSE BLOOD VI STRP: ORAL_STRIP | Status: AC

## 2010-06-18 MED ORDER — FREESTYLE LANCETS MISC: 1.0000 | Freq: Three times a day (TID) | Status: DC

## 2010-10-02 ENCOUNTER — Other Ambulatory Visit: Payer: Self-pay | Admitting: Gastroenterology

## 2010-10-02 ENCOUNTER — Other Ambulatory Visit: Payer: Self-pay | Admitting: Family Medicine

## 2010-10-18 ENCOUNTER — Other Ambulatory Visit: Payer: Self-pay | Admitting: Family Medicine

## 2010-10-18 MED ORDER — METFORMIN HCL 500 MG PO TABS: 500.0000 mg | ORAL_TABLET | Freq: Two times a day (BID) | ORAL | Status: DC

## 2010-10-18 NOTE — Telephone Encounter (Signed)
Patient needs refill metformin - [facility]

## 2010-10-18 NOTE — Telephone Encounter (Signed)
DONE

## 2010-12-13 ENCOUNTER — Encounter: Payer: Self-pay | Admitting: Family Medicine

## 2010-12-14 ENCOUNTER — Ambulatory Visit: Payer: 59 | Admitting: Family Medicine

## 2011-03-08 ENCOUNTER — Encounter: Payer: Self-pay | Admitting: Family Medicine

## 2011-03-08 ENCOUNTER — Ambulatory Visit (INDEPENDENT_AMBULATORY_CARE_PROVIDER_SITE_OTHER): Payer: 59 | Admitting: Family Medicine

## 2011-03-08 DIAGNOSIS — E785 Hyperlipidemia, unspecified: Secondary | ICD-10-CM

## 2011-03-08 DIAGNOSIS — E119 Type 2 diabetes mellitus without complications: Secondary | ICD-10-CM

## 2011-03-08 DIAGNOSIS — I10 Essential (primary) hypertension: Secondary | ICD-10-CM

## 2011-03-08 DIAGNOSIS — Z0000 Encounter for general adult medical examination without abnormal findings: Secondary | ICD-10-CM

## 2011-03-08 LAB — CBC WITH DIFFERENTIAL/PLATELET
Basophils Relative: 0.5 % (ref 0.0–3.0)
Eosinophils Relative: 1.7 % (ref 0.0–5.0)
HCT: 42.4 % (ref 39.0–52.0)
MCV: 93.2 fl (ref 78.0–100.0)
Monocytes Relative: 13.1 % — ABNORMAL HIGH (ref 3.0–12.0)
Neutrophils Relative %: 55.9 % (ref 43.0–77.0)
Platelets: 314 10*3/uL (ref 150.0–400.0)
RBC: 4.55 Mil/uL (ref 4.22–5.81)
WBC: 7.7 10*3/uL (ref 4.5–10.5)

## 2011-03-08 LAB — HEPATIC FUNCTION PANEL
ALT: 40 U/L (ref 0–53)
AST: 29 U/L (ref 0–37)
Albumin: 4.3 g/dL (ref 3.5–5.2)
Alkaline Phosphatase: 28 U/L — ABNORMAL LOW (ref 39–117)
Total Protein: 7.1 g/dL (ref 6.0–8.3)

## 2011-03-08 LAB — HEMOGLOBIN A1C: Hgb A1c MFr Bld: 6.6 % — ABNORMAL HIGH (ref 4.6–6.5)

## 2011-03-08 LAB — LIPID PANEL: Total CHOL/HDL Ratio: 3

## 2011-03-08 LAB — BASIC METABOLIC PANEL
BUN: 22 mg/dL (ref 6–23)
CO2: 28 mEq/L (ref 19–32)
Glucose, Bld: 135 mg/dL — ABNORMAL HIGH (ref 70–99)
Potassium: 4.5 mEq/L (ref 3.5–5.1)
Sodium: 138 mEq/L (ref 135–145)

## 2011-03-08 MED ORDER — FENOFIBRATE 160 MG PO TABS: 160.0000 mg | ORAL_TABLET | Freq: Every day | ORAL | Status: DC

## 2011-03-08 NOTE — Assessment & Plan Note (Signed)
Chronic problem.  Typically well controlled.  Check labs.  Adjust meds prn.  UTD on eye exam.

## 2011-03-08 NOTE — Patient Instructions (Signed)
Schedule your complete physical at your convenience- you can eat We'll notify you of your lab results and make any changes if needed Keep up the good work!  You look great! Call with any questions or concerns Happy Valentine's Day!!!

## 2011-03-08 NOTE — Progress Notes (Signed)
  Subjective:    Patient ID: Pedro [name], male    DOB: 06-04-1955, 56 y.o.   MRN: [id number]  HPI DM- chronic problem, CBGs running in the 90s typically.  On metformin.  Denies symptomatic lows.  UTD on eye exam.  No numbness or tingling of hands or feet.  HTN- chronic problem, on lisinopril HCTZ daily.  Denies CP, SOB, HAs, visual changes.  Hyperlipidemia- chronic problem, on Zocor.  Denies abd pain, N/V.   Review of Systems For ROS see HPI     Objective:   Physical Exam  Vitals reviewed. Constitutional: He is oriented to person, place, and time. He appears well-developed and well-nourished. No distress.  HENT:  Head: Normocephalic and atraumatic.  Eyes: Conjunctivae and EOM are normal. Pupils are equal, round, and reactive to light.  Neck: Normal range of motion. Neck supple. No thyromegaly present.  Cardiovascular: Normal rate, regular rhythm, normal heart sounds and intact distal pulses.   No murmur heard. Pulmonary/Chest: Effort normal and breath sounds normal. No respiratory distress.  Abdominal: Soft. Bowel sounds are normal. He exhibits no distension.  Musculoskeletal: He exhibits no edema.  Lymphadenopathy:    He has no cervical adenopathy.  Neurological: He is alert and oriented to person, place, and time. No cranial nerve deficit.  Skin: Skin is warm and dry.  Psychiatric: He has a normal mood and affect. His behavior is normal.          Assessment & Plan:

## 2011-03-08 NOTE — Assessment & Plan Note (Signed)
Chronic problem.  Tolerating statin w/out difficulty.  Check labs.  Adjust meds prn  

## 2011-03-08 NOTE — Assessment & Plan Note (Signed)
Chronic problem.  Excellent control.  Asymptomatic.  No changes. 

## 2011-03-10 LAB — PSA: PSA: 0.33 ng/mL (ref 0.10–4.00)

## 2011-03-10 LAB — TSH: TSH: 1.56 u[IU]/mL (ref 0.35–5.50)

## 2011-03-29 ENCOUNTER — Encounter: Payer: Self-pay | Admitting: *Deleted

## 2011-04-24 ENCOUNTER — Other Ambulatory Visit: Payer: Self-pay | Admitting: Gastroenterology

## 2011-05-05 ENCOUNTER — Telehealth: Payer: Self-pay | Admitting: *Deleted

## 2011-05-05 NOTE — Telephone Encounter (Signed)
Switch back to Tricor

## 2011-05-05 NOTE — Telephone Encounter (Signed)
Pt states that since switching from tricor to the fenofibrate he has been experiencing muscle pain and weakness.Please advise

## 2011-05-05 NOTE — Telephone Encounter (Signed)
.  left message to have patient return my call.  

## 2011-05-09 NOTE — Telephone Encounter (Signed)
.  left message to have patient return my call.  

## 2011-05-12 NOTE — Telephone Encounter (Signed)
.  left message to have patient return my call.  

## 2011-05-20 NOTE — Telephone Encounter (Signed)
Letter mailed for patient to contact the office after 3 attempts.     KP

## 2011-08-25 ENCOUNTER — Telehealth: Payer: Self-pay | Admitting: Family Medicine

## 2011-08-25 MED ORDER — LISINOPRIL-HYDROCHLOROTHIAZIDE 10-12.5 MG PO TABS: 1.0000 | ORAL_TABLET | Freq: Every day | ORAL | Status: DC

## 2011-08-25 NOTE — Telephone Encounter (Signed)
Refill: Lisinopril/hctz 10/12.5mg  tabs. Take 1 tablet daily. Qty 90.

## 2011-08-25 NOTE — Telephone Encounter (Signed)
rx sent to pharmacy by e-script for #90 no refills per pt overdue for CPE and advised on 2-13 visit to schedule, left vm to call and schedule CPE

## 2011-09-05 ENCOUNTER — Other Ambulatory Visit: Payer: Self-pay | Admitting: Family Medicine

## 2011-09-05 NOTE — Telephone Encounter (Signed)
Noted pt was advised on 03-08-11 to have CPE asap, left vm on pt home phone that noted pt stating " Hi this is Pedro [name] leave a message" on 08-25-11 to call office to schedule CPE/OV/ with labs asap, also left vm today for pt to call office for CPE/OV with labs, MD Tabori gave verbal instructions to refuse medication refill until pt has OV. Medication denied via escribe

## 2011-09-20 MED ORDER — METFORMIN HCL 500 MG PO TABS: 500.0000 mg | ORAL_TABLET | Freq: Two times a day (BID) | ORAL | Status: DC

## 2011-09-20 NOTE — Addendum Note (Signed)
Addended by: Derry Lory A on: 09/20/2011 02:47 PM   Modules accepted: Orders

## 2011-09-20 NOTE — Addendum Note (Signed)
Addended by: Greggory Brandy R on: 09/20/2011 08:50 AM   Modules accepted: Orders

## 2011-09-20 NOTE — Telephone Encounter (Signed)
rx sent to pharmacy by e-script to mail order for 3 months supply per MD Tabori noted in staff message per addendum to sent pt to last until upcoming CPE scheduled

## 2011-09-20 NOTE — Telephone Encounter (Signed)
cpe shceduled for 10.21.13 at 10am can we send meds to pharmacy as stated below?

## 2011-10-06 ENCOUNTER — Other Ambulatory Visit: Payer: Self-pay | Admitting: Family Medicine

## 2011-10-06 MED ORDER — SIMVASTATIN 40 MG PO TABS: 40.0000 mg | ORAL_TABLET | Freq: Every day | ORAL | Status: DC

## 2011-10-06 NOTE — Telephone Encounter (Signed)
refill simvastatin tabs 40mg  #90-wt/1-refill  take one tablet at bedtime  Last ov 2.12.13 46-month follow up wt/labs CPE scheduled for 10.21.13

## 2011-10-06 NOTE — Telephone Encounter (Signed)
rx sent to pharmacy by e-script  

## 2011-10-06 NOTE — Addendum Note (Signed)
Addended by: Derry Lory A on: 10/06/2011 02:58 PM   Modules accepted: Orders

## 2011-10-24 ENCOUNTER — Ambulatory Visit (INDEPENDENT_AMBULATORY_CARE_PROVIDER_SITE_OTHER): Payer: 59 | Admitting: Gastroenterology

## 2011-10-24 ENCOUNTER — Encounter: Payer: Self-pay | Admitting: Gastroenterology

## 2011-10-24 VITALS — BP 130/70 | HR 70 | Ht 67.0 in | Wt 217.2 lb

## 2011-10-24 DIAGNOSIS — K429 Umbilical hernia without obstruction or gangrene: Secondary | ICD-10-CM

## 2011-10-24 DIAGNOSIS — Z860100 Personal history of colon polyps, unspecified: Secondary | ICD-10-CM

## 2011-10-24 DIAGNOSIS — K219 Gastro-esophageal reflux disease without esophagitis: Secondary | ICD-10-CM

## 2011-10-24 MED ORDER — OMEPRAZOLE 40 MG PO CPDR: 40.0000 mg | DELAYED_RELEASE_CAPSULE | Freq: Every day | ORAL | Status: DC

## 2011-10-24 NOTE — Progress Notes (Signed)
History of Present Illness: This is a 56 year old male. He has chronic GERD which is generally well-controlled on omeprazole. Complains pain and swelling with his umbilical hernia. His symptoms have slowly worsened over the past few years. He first noticed it shortly after his laparoscopic cholecystectomy. Last underwent colonoscopy in August 2011 for followup of tubulovillous adenomatous colon polyps. Denies weight loss, abdominal pain, constipation, diarrhea, change in stool caliber, melena, hematochezia, nausea, vomiting, dysphagia, reflux symptoms, chest pain.  Current Medications, Allergies, Past Medical History, Past Surgical History, Family History and Social History were reviewed in [employer] record.  Physical Exam: General: Well developed , well nourished, no acute distress Head: Normocephalic and atraumatic Eyes:  sclerae anicteric, EOMI Ears: Normal auditory acuity Mouth: No deformity or lesions Lungs: Clear throughout to auscultation Heart: Regular rate and rhythm; no murmurs, rubs or bruits Abdomen: Soft, non tender and non distended. No masses, hepatosplenomegaly noted. Normal Bowel sounds. He has an umbilical hernia that it is not easy to reduce and is minimally tender. Musculoskeletal: Symmetrical with no gross deformities  Pulses:  Normal pulses noted Extremities: No clubbing, cyanosis, edema or deformities noted Neurological: Alert oriented x 4, grossly nonfocal Psychological:  Alert and cooperative. Normal mood and affect  Assessment and Recommendations:  1. GERD. Refilled omeprazole 40 mg daily. Continue standard antireflux measures.  2. Umbilical hernia. Surgical referral.  3. Personal history tubulovillous adenomatous colon polyps. Surveillance colonoscopy recommended August 2016.

## 2011-10-24 NOTE — Patient Instructions (Addendum)
You have been scheduled for an appointment with Dr. Michaell Cowing at [facility]. Your appointment is on 11-03-2011 at 2:30pm. Please arrive at 2 pm for registration. Make certain to bring a list of current medications, including any over the counter medications or vitamins. Also bring your co-pay if you have one as well as your insurance cards. [facility] is located at [address]. Should you need to reschedule your appointment, please contact them at [phone].  We have sent the following medications to your pharmacy for you to pick up at your convenience:Prilosec 40 mg to take one by mouth once daily

## 2011-11-03 ENCOUNTER — Ambulatory Visit (INDEPENDENT_AMBULATORY_CARE_PROVIDER_SITE_OTHER): Payer: 59 | Admitting: Surgery

## 2011-11-04 ENCOUNTER — Telehealth: Payer: Self-pay | Admitting: Gastroenterology

## 2011-11-04 MED ORDER — OMEPRAZOLE 40 MG PO CPDR: 40.0000 mg | DELAYED_RELEASE_CAPSULE | Freq: Every day | ORAL | Status: DC

## 2011-11-04 NOTE — Telephone Encounter (Signed)
Prescription send to [facility] mail order pharmacy.

## 2011-11-09 ENCOUNTER — Other Ambulatory Visit: Payer: Self-pay | Admitting: Family Medicine

## 2011-11-09 NOTE — Telephone Encounter (Signed)
refill x 2 requesting 90-day supply on both---last ov 2.12.13 V70  1-Lisinop-HCTZ TAB10-12.5 MG Take 1 tablet by mouth daily.--last wrt 8.1.13 #90  2-Metformin tab 500mg  Take 1 tablet (500 mg total) by mouth 2 (two) times daily with a meal.--last wrt 8.27.13 #180

## 2011-11-10 NOTE — Telephone Encounter (Signed)
Attempted to reach pt to verify that he would like to continue using [facility] for refills below and left message to call back with confirmation.

## 2011-11-11 MED ORDER — LISINOPRIL-HYDROCHLOROTHIAZIDE 10-12.5 MG PO TABS: 1.0000 | ORAL_TABLET | Freq: Every day | ORAL | Status: DC

## 2011-11-11 MED ORDER — METFORMIN HCL 500 MG PO TABS: 500.0000 mg | ORAL_TABLET | Freq: Two times a day (BID) | ORAL | Status: DC

## 2011-11-11 NOTE — Telephone Encounter (Signed)
Left message on # below re: refills I sent today and to call if pt's needs refills on additional medications.

## 2011-11-11 NOTE — Telephone Encounter (Signed)
Pt's wife Jennette Kettle called stating they do want to use [facility] for prescriptions. Pt has Rx's that will be due for refill soon. Please return her call at #[phone].

## 2011-11-14 ENCOUNTER — Encounter: Payer: 59 | Admitting: Family Medicine

## 2011-11-21 ENCOUNTER — Ambulatory Visit (INDEPENDENT_AMBULATORY_CARE_PROVIDER_SITE_OTHER): Payer: 59 | Admitting: Surgery

## 2011-12-05 ENCOUNTER — Ambulatory Visit (INDEPENDENT_AMBULATORY_CARE_PROVIDER_SITE_OTHER): Payer: 59 | Admitting: Surgery

## 2011-12-05 ENCOUNTER — Telehealth: Payer: Self-pay | Admitting: Family Medicine

## 2011-12-05 MED ORDER — SIMVASTATIN 40 MG PO TABS: 40.0000 mg | ORAL_TABLET | Freq: Every day | ORAL | Status: DC

## 2011-12-05 NOTE — Telephone Encounter (Signed)
LM ON TRIAGE LINE 1028am 11.11.13--needs refill for Simvastatin (Tab), sent to [facility] cb# [id number] NOTE per wife pt is going out of town normally use mail order but needs before he leaves to go out of town  Her call back# [phone]

## 2011-12-05 NOTE — Addendum Note (Signed)
Addended by: Candie Echevaria L on: 12/05/2011 04:32 PM   Modules accepted: Orders

## 2011-12-05 NOTE — Telephone Encounter (Signed)
Rx sent 

## 2011-12-07 ENCOUNTER — Telehealth: Payer: Self-pay | Admitting: *Deleted

## 2011-12-07 NOTE — Telephone Encounter (Signed)
Pt came to office to request a copy of his most recent Hgb A1C, copy provided for his employer. Vidal Schwalbe, RN

## 2011-12-08 ENCOUNTER — Encounter: Payer: Self-pay | Admitting: Family Medicine

## 2012-01-24 ENCOUNTER — Ambulatory Visit (INDEPENDENT_AMBULATORY_CARE_PROVIDER_SITE_OTHER): Payer: 59 | Admitting: Family Medicine

## 2012-01-24 ENCOUNTER — Encounter: Payer: Self-pay | Admitting: Family Medicine

## 2012-01-24 VITALS — BP 128/70 | HR 85 | Temp 97.8°F | Ht 67.0 in | Wt 211.8 lb

## 2012-01-24 DIAGNOSIS — J329 Chronic sinusitis, unspecified: Secondary | ICD-10-CM

## 2012-01-24 MED ORDER — GUAIFENESIN-CODEINE 100-10 MG/5ML PO SYRP: 10.0000 mL | ORAL_SOLUTION | Freq: Three times a day (TID) | ORAL | Status: DC | PRN

## 2012-01-24 MED ORDER — SULFAMETHOXAZOLE-TRIMETHOPRIM 800-160 MG PO TABS: 1.0000 | ORAL_TABLET | Freq: Two times a day (BID) | ORAL | Status: DC

## 2012-01-24 NOTE — Patient Instructions (Addendum)
This is an early sinus infection Start the Bactrim DS- 1 tab twice daily w/ food Take the cough syrup before bed Drink plenty of fluids REST! Mucinex DM during the day for cough and congestion Call with any questions or concerns Happy New Year!!

## 2012-01-24 NOTE — Progress Notes (Signed)
  Subjective:    Patient ID: Pedro [name], male    DOB: 1955/03/16, 56 y.o.   MRN: [id number]  HPI URI- sxs started 5-6 days ago.  Nasal congestion, cough- intermittently productive, facial pain/pressure, bilateral ear fullness.  1 day of 'real sore throat' but this improved.  + sick contacts.  No N/V/D.  No fevers.  sxs have been progressively worsening.   Review of Systems For ROS see HPI     Objective:   Physical Exam  Vitals reviewed. Constitutional: He appears well-developed and well-nourished. No distress.  HENT:  Head: Normocephalic and atraumatic.  Right Ear: Tympanic membrane normal.  Left Ear: Tympanic membrane normal.  Nose: Mucosal edema and rhinorrhea present. Right sinus exhibits maxillary sinus tenderness and frontal sinus tenderness. Left sinus exhibits maxillary sinus tenderness and frontal sinus tenderness.  Mouth/Throat: Mucous membranes are normal. Oropharyngeal exudate and posterior oropharyngeal erythema present. No posterior oropharyngeal edema.       + PND  Eyes: Conjunctivae normal and EOM are normal. Pupils are equal, round, and reactive to light.  Neck: Normal range of motion. Neck supple.  Cardiovascular: Normal rate, regular rhythm and normal heart sounds.   Pulmonary/Chest: Effort normal and breath sounds normal. No respiratory distress. He has no wheezes.       + hacking cough  Lymphadenopathy:    He has no cervical adenopathy.  Skin: Skin is warm and dry.          Assessment & Plan:

## 2012-01-24 NOTE — Assessment & Plan Note (Signed)
New.  Pt's sxs and PE consistent w/ infxn.  Start abx.  Cough meds prn.  Reviewed supportive care and red flags that should prompt return.  Pt expressed understanding and is in agreement w/ plan.  

## 2012-01-27 ENCOUNTER — Other Ambulatory Visit: Payer: Self-pay | Admitting: *Deleted

## 2012-01-27 MED ORDER — SIMVASTATIN 40 MG PO TABS: 40.0000 mg | ORAL_TABLET | Freq: Every day | ORAL | Status: DC

## 2012-01-27 NOTE — Telephone Encounter (Signed)
Rx sent 

## 2012-02-06 ENCOUNTER — Ambulatory Visit: Payer: 59 | Admitting: Physician Assistant

## 2012-02-06 ENCOUNTER — Encounter: Payer: Self-pay | Admitting: Physician Assistant

## 2012-02-06 ENCOUNTER — Telehealth: Payer: Self-pay | Admitting: Gastroenterology

## 2012-02-06 ENCOUNTER — Ambulatory Visit (INDEPENDENT_AMBULATORY_CARE_PROVIDER_SITE_OTHER): Payer: 59 | Admitting: Physician Assistant

## 2012-02-06 ENCOUNTER — Ambulatory Visit (INDEPENDENT_AMBULATORY_CARE_PROVIDER_SITE_OTHER)
Admission: RE | Admit: 2012-02-06 | Discharge: 2012-02-06 | Disposition: A | Discharge status: Home / Self Care | Payer: 59 | Source: Ambulatory Visit | Attending: Physician Assistant | Admitting: Physician Assistant

## 2012-02-06 ENCOUNTER — Other Ambulatory Visit (INDEPENDENT_AMBULATORY_CARE_PROVIDER_SITE_OTHER): Payer: 59

## 2012-02-06 VITALS — BP 106/72 | HR 72 | Ht 67.0 in | Wt 212.2 lb

## 2012-02-06 DIAGNOSIS — R112 Nausea with vomiting, unspecified: Secondary | ICD-10-CM

## 2012-02-06 DIAGNOSIS — R109 Unspecified abdominal pain: Secondary | ICD-10-CM

## 2012-02-06 DIAGNOSIS — R141 Gas pain: Secondary | ICD-10-CM

## 2012-02-06 DIAGNOSIS — R143 Flatulence: Secondary | ICD-10-CM

## 2012-02-06 DIAGNOSIS — R140 Abdominal distension (gaseous): Secondary | ICD-10-CM

## 2012-02-06 LAB — CBC WITH DIFFERENTIAL/PLATELET
Basophils Absolute: 0 10*3/uL (ref 0.0–0.1)
Basophils Relative: 0.2 % (ref 0.0–3.0)
Eosinophils Absolute: 0.2 10*3/uL (ref 0.0–0.7)
Lymphocytes Relative: 36.3 % (ref 12.0–46.0)
MCHC: 34 g/dL (ref 30.0–36.0)
MCV: 92.2 fl (ref 78.0–100.0)
Monocytes Absolute: 0.9 10*3/uL (ref 0.1–1.0)
Neutrophils Relative %: 51.3 % (ref 43.0–77.0)
Platelets: 316 10*3/uL (ref 150.0–400.0)
RDW: 12.5 % (ref 11.5–14.6)

## 2012-02-06 LAB — COMPREHENSIVE METABOLIC PANEL
ALT: 33 U/L (ref 0–53)
AST: 21 U/L (ref 0–37)
Albumin: 4.1 g/dL (ref 3.5–5.2)
Alkaline Phosphatase: 27 U/L — ABNORMAL LOW (ref 39–117)
BUN: 24 mg/dL — ABNORMAL HIGH (ref 6–23)
Chloride: 102 mEq/L (ref 96–112)
Potassium: 3.9 mEq/L (ref 3.5–5.1)
Sodium: 136 mEq/L (ref 135–145)

## 2012-02-06 MED ORDER — HYOSCYAMINE SULFATE 0.125 MG PO TABS: 0.1250 mg | ORAL_TABLET | ORAL | Status: DC | PRN

## 2012-02-06 MED ORDER — HYDROCODONE-ACETAMINOPHEN 5-500 MG PO TABS: ORAL_TABLET | ORAL | Status: DC

## 2012-02-06 MED ORDER — ONDANSETRON 4 MG PO TBDP: ORAL_TABLET | ORAL | Status: DC

## 2012-02-06 NOTE — Patient Instructions (Addendum)
We have scheduled an X-ray for today in our [facility], basement level. Also go to our lab for blood work, basement level. Stay on Prilosec 40 mg one tab daily. We sent a refill on the Levsin ( Hyoscyamine)  as needed for cramping.  We also sent prescription for Zofran 4 mg ODT, dissolve on the tongue. We also sent prescription for Vicodin ( Hydrocodone).   Pharmacy: [facility].

## 2012-02-06 NOTE — Telephone Encounter (Signed)
I had faxed a prescription for Vicodin 5/500 mg to [facility].  Pharmacist from [facility] , [place], called to advise they are no longer making the Vicodin 5/500 mg. They are making the 5/325 mg and the 750 mg.  I asked Amy Esterwood PA-C and she said the 5/325/mg is fine.Marland Kitchen  I advised Wayne from [employer] of this.

## 2012-02-06 NOTE — Telephone Encounter (Signed)
Patient with abdominal pain and reflux.  Pain is severe at times in the past and he feels his symptoms now are very consistent to the past.  He is "tasting bile" he feels that his symptoms are progressing and wants to be seen today.  He will come in and see Amy Esterwood PA today at 3:00

## 2012-02-06 NOTE — Telephone Encounter (Signed)
Error

## 2012-02-07 ENCOUNTER — Encounter: Payer: 59 | Admitting: Family Medicine

## 2012-02-07 ENCOUNTER — Encounter: Payer: Self-pay | Admitting: Physician Assistant

## 2012-02-07 NOTE — Progress Notes (Signed)
Subjective:    Patient ID: Pedro [name], male    DOB: 23-Jun-1955, 57 y.o.   MRN: [id number]  HPI Pedro [name] is a 57 year old white male known to Dr. Russella Dar with history of chronic GERD. He also has diverticular disease, is status post cholecystectomy in 2001 as history of adult onset diabetes mellitus and an umbilical hernia. He had colonoscopy in 2011 with finding of mild diverticulosis of the sigmoid colon, no polyps. However prior to that he had had a tubovillous adenoma in 2007. He is for 5 year interval followup. He is maintained on Prilosec 40 mg by mouth daily chronically for his reflux symptoms and says generally this works well however he gets periodic episodes of severe abdominal gas bloating and belching of sour "rotten egg" serial Which is been followed by abdominal distention nausea and vomiting as well as abdominal pain which may last for a day or 2. He states with these episodes he usually has mild diarrhea as well. He says he has been having recurrent similar episodes over the past several years the last one was about 2 years ago. He has never had an explanation for these episodes though has been given Levsin sublingual to use per Dr. Russella Dar and says that this is helpful. He comes in today because he had another episode onset yesterday and is still symptomatic. He has not had any further vomiting today but says he still feels bloated and has been belching is uncomfortable in his upper abdomen. His only abdominal surgery was the cholecystectomy. He says he's had such bad episodes in the past that he is required ER visits for pain medication and antiemetics.and wants to know if there is anything he can do to overt these episodes. He also relates a years ago he was treated by Dr. Sherin Quarry thought to have abdominal migraines and was given a trial of Imitrex which actually made him very sick. He is unaware of any precipitating factors. food triggers etc.    Review of Systems  Constitutional:  Positive for appetite change.  HENT: Negative.   Eyes: Negative.   Respiratory: Negative.   Cardiovascular: Negative.   Gastrointestinal: Positive for nausea, vomiting, abdominal pain, diarrhea and abdominal distention.  Genitourinary: Negative.   Musculoskeletal: Negative.   Neurological: Negative.   Hematological: Negative.   Psychiatric/Behavioral: Negative.    Outpatient Prescriptions Prior to Visit  Medication Sig Dispense Refill  . aspirin 81 MG tablet Take 81 mg by mouth daily.      . fenofibrate 160 MG tablet Take 1 tablet (160 mg total) by mouth daily.  90 tablet  3  . guaiFENesin-codeine (ROBITUSSIN AC) 100-10 MG/5ML syrup Take 10 mLs by mouth 3 (three) times daily as needed for cough.  240 mL  0  . Lancets (FREESTYLE) lancets 1 each by Other route 3 (three) times daily. PRN  100 each  3  . lisinopril-hydrochlorothiazide (PRINZIDE,ZESTORETIC) 10-12.5 MG per tablet Take 1 tablet by mouth daily.  90 tablet  1  . metFORMIN (GLUCOPHAGE) 500 MG tablet Take 1 tablet (500 mg total) by mouth 2 (two) times daily with a meal.  180 tablet  1  . omeprazole (PRILOSEC) 40 MG capsule Take 1 capsule (40 mg total) by mouth daily.  90 capsule  3  . simvastatin (ZOCOR) 40 MG tablet Take 1 tablet (40 mg total) by mouth at bedtime. Office visit due  90 tablet  0  . [DISCONTINUED] hyoscyamine (LEVSIN, ANASPAZ) 0.125 MG tablet Take 0.125 mg by mouth every 4 (  four) hours as needed.        . [DISCONTINUED] sulfamethoxazole-trimethoprim (BACTRIM DS,SEPTRA DS) 800-160 MG per tablet Take 1 tablet by mouth 2 (two) times daily.  20 tablet  0   Last reviewed on 02/07/2012  1:19 PM by Sammuel Cooper, PA  Allergies  Allergen Reactions  . Fish Oil     REACTION: GI upset  . Penicillins    Patient Active Problem List  Diagnosis  . GERD  . NAUSEA  . Flatulence, eructation, and gas pain  . DIARRHEA  . Abdominal pain, generalized  . PERSONAL HX COLONIC POLYPS  . DIABETES MELLITUS, TYPE II  .  HYPERLIPIDEMIA  . HYPERTENSION, BENIGN ESSENTIAL  . Sinusitis   History  Substance Use Topics  . Smoking status: Never Smoker   . Smokeless tobacco: Never Used  . Alcohol Use: No   family history includes Breast cancer in his sister; Diabetes in his paternal aunt; and Heart disease in his father.  There is no history of Colon cancer.     Objective:   Physical Exam of well-developed white male in no acute distress, somewhat uncomfortable-appearing accompanied by his wife blood pressure 106/72 pulse 72 height 5 foot 7 weight 212. HEENT; nontraumatic normocephalic EOMI PERRLA sclera anicteric, Neck supple no JVD, Cardiovascular; regular rate and rhythm with S1-S2 no murmur or gallop, Pulmonary; clear bilaterally, Abdomen; large soft not appreciably distended bowel sounds are present is mildly tender across the upper abdomen no guarding or rebound no palpable mass or hepatosplenomegaly, Rectal; exam not done, Extremities; no clubbing cyanosis or edema skin warm dry, Psych; mood and affect normal and appropriate.        Assessment & Plan:  #48  57 year old male status post cholecystectomy with chronic GERD and adult-onset diabetes melena who presents with recurrent episodes of abdominal bloating gas sour belching complaints of abdominal distention and mild diarrhea. These episodes are fairly intense may last 24-48 hours, and fortunately are very sporadic. Symptoms are partially relieved with anti-spasmodic site use Levsin. Etiology is unclear.His umbilical hernia is soft and reducible. - would be concerned that he has low-grade partial obstruction intermittently or an intussusception or  episodes of gastroparesis,etc  Plan; we'll obtain baseline labs today including liver function studies and lipase plain abdominal films today Refill Levsin sublingual for when necessary use Continue Prilosec 40 mg by mouth daily Trial of Zofran ODT 4 mg every 6 hours when necessary nausea, and also have given  him a limited supply of Vicodin 5/ 500 every 4-6 hours as needed for pain to try to overt ER visits Further plans pending results of labs and abdominal films . He may need a CT scan of the abdomen and pelvis with his next episode

## 2012-02-07 NOTE — Progress Notes (Signed)
Reviewed and agree with management plan.  Kamalei Roeder T. Mikea Quadros, MD FACG 

## 2012-02-10 ENCOUNTER — Telehealth: Payer: Self-pay | Admitting: Family Medicine

## 2012-02-10 MED ORDER — SIMVASTATIN 40 MG PO TABS: 40.0000 mg | ORAL_TABLET | Freq: Every day | ORAL | Status: DC

## 2012-02-10 NOTE — Telephone Encounter (Signed)
The patient's wife called and is in need of two weeks worth of Simvastatin called into [facility].  Her callback number is [phone]

## 2012-02-10 NOTE — Telephone Encounter (Signed)
Spoke with the pt's wife and informed her RF request is ready and sent to pharmacy([facility]) by e-script.  Also informed her the pt will need to call the office an schedule an appt.  Wife agreed.//AB/CMA

## 2012-02-10 NOTE — Telephone Encounter (Signed)
Ok for #30, no refills- OV due.  Please send to local pharmacy

## 2012-02-10 NOTE — Telephone Encounter (Signed)
pt spouse called stated Simvastatin was lost in mail--needs something asap sent to [facility] as pt is going out [employer], pt has not had in 3-days  Please call carol at [phone]

## 2012-02-10 NOTE — Telephone Encounter (Signed)
Please advise 

## 2012-02-29 ENCOUNTER — Other Ambulatory Visit: Payer: Self-pay | Admitting: Family Medicine

## 2012-02-29 NOTE — Telephone Encounter (Signed)
°  Pts wife called stated her spouse has strips that are expired and the RX he has at home has also expired--pt uses [facility] Pt uses freestyle lites

## 2012-03-01 MED ORDER — GLUCOSE BLOOD VI STRP: ORAL_STRIP | Status: DC

## 2012-03-01 NOTE — Telephone Encounter (Signed)
Rx sent to the pharmacy by e-script.//AB/CMA 

## 2012-04-02 ENCOUNTER — Encounter: Payer: Self-pay | Admitting: Family Medicine

## 2012-05-15 ENCOUNTER — Telehealth: Payer: Self-pay | Admitting: Family Medicine

## 2012-05-15 NOTE — Telephone Encounter (Signed)
Refill: Metformin tab 500 mg. Take 1 tablet by mouth twice a day with a meal. Qty 180 Fenofibrate tab 160 mg. Take 1 tablet daily. Qty 90 Simvastatin tab 40 mg. Take 1 tablet by mouth at bedtime. Office visit due. Qty 90 Lisinopril-hctz tab 10-12.5 mg. Take 1 tablet by mouth daily. Qty 90

## 2012-05-15 NOTE — Telephone Encounter (Signed)
LM @ (4:34pm) asking the pt to RTC regarding refill request.//AB/CMA

## 2012-05-16 NOTE — Telephone Encounter (Signed)
LM @ (12:08pm) asking the pt to RTC.//AB/CMA

## 2012-05-17 MED ORDER — LISINOPRIL-HYDROCHLOROTHIAZIDE 10-12.5 MG PO TABS: 1.0000 | ORAL_TABLET | Freq: Every day | ORAL | Status: DC

## 2012-05-17 MED ORDER — METFORMIN HCL 500 MG PO TABS: 500.0000 mg | ORAL_TABLET | Freq: Two times a day (BID) | ORAL | Status: DC

## 2012-05-17 MED ORDER — FENOFIBRATE 160 MG PO TABS: 160.0000 mg | ORAL_TABLET | Freq: Every day | ORAL | Status: DC

## 2012-05-17 MED ORDER — SIMVASTATIN 40 MG PO TABS: 40.0000 mg | ORAL_TABLET | Freq: Every day | ORAL | Status: DC

## 2012-05-17 NOTE — Telephone Encounter (Signed)
Spoke with the pt's wife(Pedro [name]) and informed her that the pt has not been seen since (03-08-11), and needs an office visit.   So we will not be able to refill his meds for 3 mos.  We can sent his refills to an locator pharmacy, but for a 30 day supply.  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  She stated that he was here recently but she had surgery and he was with her.  She stated if we could call in 30 day supply to locator pharmacy, and she will have him schedule an appt when he comes back in town.  Rx's sent to the pharmacy by e-script.//AB/CMA

## 2012-07-04 ENCOUNTER — Encounter: Payer: Self-pay | Admitting: Family Medicine

## 2012-07-04 ENCOUNTER — Ambulatory Visit (INDEPENDENT_AMBULATORY_CARE_PROVIDER_SITE_OTHER): Payer: 59 | Admitting: Family Medicine

## 2012-07-04 VITALS — BP 130/70 | HR 68 | Temp 97.9°F | Ht 67.5 in | Wt 212.6 lb

## 2012-07-04 DIAGNOSIS — I10 Essential (primary) hypertension: Secondary | ICD-10-CM

## 2012-07-04 DIAGNOSIS — E785 Hyperlipidemia, unspecified: Secondary | ICD-10-CM

## 2012-07-04 DIAGNOSIS — E119 Type 2 diabetes mellitus without complications: Secondary | ICD-10-CM

## 2012-07-04 LAB — CBC WITH DIFFERENTIAL/PLATELET
Basophils Relative: 0.6 % (ref 0.0–3.0)
Eosinophils Absolute: 0.1 10*3/uL (ref 0.0–0.7)
HCT: 45 % (ref 39.0–52.0)
Hemoglobin: 15.7 g/dL (ref 13.0–17.0)
Lymphocytes Relative: 32.9 % (ref 12.0–46.0)
Lymphs Abs: 2.3 10*3/uL (ref 0.7–4.0)
MCHC: 35 g/dL (ref 30.0–36.0)
MCV: 93.4 fl (ref 78.0–100.0)
Neutro Abs: 3.6 10*3/uL (ref 1.4–7.7)
RBC: 4.81 Mil/uL (ref 4.22–5.81)

## 2012-07-04 LAB — HEPATIC FUNCTION PANEL
AST: 19 U/L (ref 0–37)
Albumin: 3.8 g/dL (ref 3.5–5.2)
Alkaline Phosphatase: 40 U/L (ref 39–117)
Total Protein: 6.6 g/dL (ref 6.0–8.3)

## 2012-07-04 NOTE — Assessment & Plan Note (Signed)
Chronic problem.  Well controlled.  Asymptomatic.  Check labs.  No anticipated med changes. 

## 2012-07-04 NOTE — Assessment & Plan Note (Signed)
Chronic problem.  Tolerating statin w/out difficulty.  Check labs.  Adjust meds prn  

## 2012-07-04 NOTE — Assessment & Plan Note (Signed)
Chronic problem.  UTD on eye exam.  Asymptomatic.  Check labs.  Adjust meds prn

## 2012-07-04 NOTE — Progress Notes (Signed)
  Subjective:    Patient ID: Pedro [name], male    DOB: 1955-08-24, 57 y.o.   MRN: [id number]  HPI DM- chronic problem, on Metformin.  Pt reports CBGs have been creeping up to the 110s recently.  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  Denies symptomatic lows.  No numbness/tingling.  UTD on eye exam.  HTN- chronic problem, well controlled on Lisinopril HCTZ.  No CP, SOB, HAs, visual changes, edema.  Hyperlipidemia- chronic problem, on Zocor, fenofibrate.  No abd pain, N/V, myalgias.   Review of Systems For ROS see HPI     Objective:   Physical Exam  Vitals reviewed. Constitutional: He is oriented to person, place, and time. He appears well-developed and well-nourished. No distress.  HENT:  Head: Normocephalic and atraumatic.  Eyes: Conjunctivae and EOM are normal. Pupils are equal, round, and reactive to light.  Neck: Normal range of motion. Neck supple. No thyromegaly present.  Cardiovascular: Normal rate, regular rhythm, normal heart sounds and intact distal pulses.   No murmur heard. Pulmonary/Chest: Effort normal and breath sounds normal. No respiratory distress.  Abdominal: Soft. Bowel sounds are normal. He exhibits no distension.  + umbilical hernia  Musculoskeletal: He exhibits no edema.  Lymphadenopathy:    He has no cervical adenopathy.  Neurological: He is alert and oriented to person, place, and time. No cranial nerve deficit.  Skin: Skin is warm and dry.  Psychiatric: He has a normal mood and affect. His behavior is normal.          Assessment & Plan:

## 2012-07-04 NOTE — Patient Instructions (Addendum)
Schedule your complete physical in 3-6 months We'll notify you of your lab results and make any changes if needed Keep up the good work!  You look great! Call with any questions or concerns Have a great summer!!!

## 2012-07-05 LAB — BASIC METABOLIC PANEL
BUN: 16 mg/dL (ref 6–23)
CO2: 22 mEq/L (ref 19–32)
Calcium: 9.5 mg/dL (ref 8.4–10.5)
Chloride: 105 mEq/L (ref 96–112)
Creatinine, Ser: 1 mg/dL (ref 0.4–1.5)
GFR: 87.27 mL/min (ref >60.00)
Glucose, Bld: 149 mg/dL — ABNORMAL HIGH (ref 70–99)
Potassium: 3.9 mEq/L (ref 3.5–5.1)
Sodium: 138 mEq/L (ref 135–145)

## 2012-07-05 LAB — TSH: TSH: 2.36 u[IU]/mL (ref 0.35–5.50)

## 2012-07-05 LAB — LIPID PANEL
Total CHOL/HDL Ratio: 15
Triglycerides: 1243 mg/dL — ABNORMAL HIGH (ref 0.0–149.0)

## 2012-07-05 LAB — LDL CHOLESTEROL, DIRECT: Direct LDL: 56.7 mg/dL

## 2012-07-06 ENCOUNTER — Telehealth: Payer: Self-pay | Admitting: Family Medicine

## 2012-07-06 MED ORDER — METFORMIN HCL 500 MG PO TABS: 500.0000 mg | ORAL_TABLET | Freq: Two times a day (BID) | ORAL | Status: DC

## 2012-07-06 MED ORDER — METFORMIN HCL 500 MG PO TABS: 1000.0000 mg | ORAL_TABLET | Freq: Two times a day (BID) | ORAL | Status: DC

## 2012-07-06 MED ORDER — LISINOPRIL-HYDROCHLOROTHIAZIDE 10-12.5 MG PO TABS: 1.0000 | ORAL_TABLET | Freq: Every day | ORAL | Status: DC

## 2012-07-06 MED ORDER — FENOFIBRATE 160 MG PO TABS: 160.0000 mg | ORAL_TABLET | Freq: Every day | ORAL | Status: DC

## 2012-07-06 MED ORDER — SIMVASTATIN 40 MG PO TABS: 40.0000 mg | ORAL_TABLET | Freq: Every day | ORAL | Status: DC

## 2012-07-06 NOTE — Telephone Encounter (Signed)
Rx sent to local pharmacy. And mail order

## 2012-07-06 NOTE — Telephone Encounter (Signed)
Patient's wife states the patient needs a 2 week supply of all his meds until they come through the mail order company. Patient's wife states we were to renew all his rx when he was here this week. Pt would like to use [facility].

## 2012-07-10 ENCOUNTER — Telehealth: Payer: Self-pay | Admitting: *Deleted

## 2012-07-10 MED ORDER — METFORMIN HCL 1000 MG PO TABS: 1000.0000 mg | ORAL_TABLET | Freq: Two times a day (BID) | ORAL | Status: DC

## 2012-07-10 NOTE — Telephone Encounter (Signed)
Message copied by Verdie Shire on Tue Jul 10, 2012  5:48 PM ------      Message from: Sheliah Hatch      Created: Wed Jul 04, 2012  4:58 PM       Labs look good w/ exception of A1C which has increased from 6.6 --> 7.9  Based on this, needs to increase metformin to 1000mg  BID. ------

## 2012-07-10 NOTE — Telephone Encounter (Signed)
Spoke with the pt and informed him of recent lab results and note.  Pt understood and agreed.  New rx sent to the pharmacy by e-script.//AB/CMA

## 2012-07-11 MED ORDER — FENOFIBRATE 160 MG PO TABS: 160.0000 mg | ORAL_TABLET | Freq: Every day | ORAL | Status: DC

## 2012-07-11 NOTE — Addendum Note (Signed)
Addended by: Candie Echevaria L on: 07/11/2012 11:23 AM   Modules accepted: Orders

## 2012-11-04 ENCOUNTER — Other Ambulatory Visit: Payer: Self-pay | Admitting: Gastroenterology

## 2012-11-04 ENCOUNTER — Other Ambulatory Visit: Payer: Self-pay | Admitting: Family Medicine

## 2012-11-04 ENCOUNTER — Other Ambulatory Visit: Payer: Self-pay | Admitting: Internal Medicine

## 2012-11-05 ENCOUNTER — Other Ambulatory Visit: Payer: Self-pay | Admitting: *Deleted

## 2012-11-05 MED ORDER — LISINOPRIL-HYDROCHLOROTHIAZIDE 10-12.5 MG PO TABS: 1.0000 | ORAL_TABLET | Freq: Every day | ORAL | Status: DC

## 2012-11-05 MED ORDER — SIMVASTATIN 40 MG PO TABS: 40.0000 mg | ORAL_TABLET | Freq: Every day | ORAL | Status: DC

## 2012-11-05 NOTE — Telephone Encounter (Signed)
Simvastatin and Lisinopril sent to pharmacy.

## 2012-11-05 NOTE — Telephone Encounter (Signed)
Med filled.  

## 2012-11-26 ENCOUNTER — Telehealth: Payer: Self-pay | Admitting: *Deleted

## 2012-11-26 ENCOUNTER — Telehealth: Payer: Self-pay | Admitting: Family Medicine

## 2012-11-26 MED ORDER — GLUCOSE BLOOD VI STRP: ORAL_STRIP | Status: DC

## 2012-11-26 NOTE — Telephone Encounter (Signed)
Patient will monitor BS and if they continue to increase an appointment will be made.

## 2012-11-26 NOTE — Telephone Encounter (Signed)
Patient wife is concerned that the metformin dosage was increased and it is  making his sugar elevated. Is this normal ? Patient wife was advised that if the sugars are ranging any higher than 140 and is staying there then he would need an office visit. Please advise if the metformin can cause the BS to be elevated. SW, CMA

## 2012-11-26 NOTE — Telephone Encounter (Signed)
140 while higher than we would like is not dangerous.  Based on that elevation, needs to limit carbs in his diet.  Follow up as scheduled or if sugars continue to climb

## 2012-11-26 NOTE — Telephone Encounter (Signed)
Med filled.  

## 2012-11-26 NOTE — Telephone Encounter (Signed)
Patient wife called and stated that her husband BS is normally around 115. Patient wife states that lately for the last 2 weeks his BS has been around the 140s. She would like to know if this something that he needs to worry about. Please advise. SW

## 2012-11-26 NOTE — Telephone Encounter (Signed)
Patient's wife is calling back to check status of request.

## 2012-11-26 NOTE — Telephone Encounter (Signed)
Patient's wife is calling on his behalf to request a refill on his glucose blood test strips. Pt uses [facility].

## 2012-11-26 NOTE — Telephone Encounter (Signed)
No- Metformin does not raise blood sugars.  Suspect that he may be cheating on his diabetic diet w/ the Halloween holiday

## 2012-11-29 ENCOUNTER — Other Ambulatory Visit: Payer: Self-pay

## 2012-12-05 ENCOUNTER — Other Ambulatory Visit: Payer: Self-pay | Admitting: Gastroenterology

## 2012-12-05 ENCOUNTER — Other Ambulatory Visit: Payer: Self-pay | Admitting: Family Medicine

## 2012-12-06 NOTE — Telephone Encounter (Signed)
Med filled.  

## 2012-12-18 ENCOUNTER — Encounter: Payer: Self-pay | Admitting: General Practice

## 2013-01-28 ENCOUNTER — Other Ambulatory Visit: Payer: Self-pay | Admitting: Family Medicine

## 2013-01-28 ENCOUNTER — Other Ambulatory Visit: Payer: Self-pay | Admitting: Gastroenterology

## 2013-01-28 NOTE — Telephone Encounter (Signed)
NEEDS OFFICE VISIT.

## 2013-01-28 NOTE — Telephone Encounter (Signed)
Med filled for 1 month supply. Patient is due for an office visit. Patient was left message.

## 2013-02-02 ENCOUNTER — Other Ambulatory Visit: Payer: Self-pay | Admitting: Family Medicine

## 2013-02-02 ENCOUNTER — Other Ambulatory Visit: Payer: Self-pay | Admitting: Gastroenterology

## 2013-02-04 NOTE — Telephone Encounter (Signed)
Med filled.  

## 2013-02-04 NOTE — Telephone Encounter (Signed)
Needs office visit.

## 2013-02-05 ENCOUNTER — Encounter: Payer: Self-pay | Admitting: General Practice

## 2013-02-05 ENCOUNTER — Other Ambulatory Visit: Payer: Self-pay | Admitting: General Practice

## 2013-02-05 MED ORDER — SIMVASTATIN 40 MG PO TABS: ORAL_TABLET | ORAL | Status: DC

## 2013-02-20 ENCOUNTER — Ambulatory Visit (INDEPENDENT_AMBULATORY_CARE_PROVIDER_SITE_OTHER): Payer: 59 | Admitting: Gastroenterology

## 2013-02-20 ENCOUNTER — Encounter: Payer: Self-pay | Admitting: Gastroenterology

## 2013-02-20 VITALS — BP 130/72 | HR 68 | Ht 67.5 in | Wt 210.8 lb

## 2013-02-20 DIAGNOSIS — K429 Umbilical hernia without obstruction or gangrene: Secondary | ICD-10-CM

## 2013-02-20 DIAGNOSIS — K219 Gastro-esophageal reflux disease without esophagitis: Secondary | ICD-10-CM

## 2013-02-20 DIAGNOSIS — Z86010 Personal history of colonic polyps: Secondary | ICD-10-CM

## 2013-02-20 MED ORDER — OMEPRAZOLE 40 MG PO CPDR: DELAYED_RELEASE_CAPSULE | ORAL | Status: DC

## 2013-02-20 NOTE — Progress Notes (Signed)
    History of Present Illness: This is a 58 year old male with GERD and has been well-controlled on omeprazole daily. He denies any episodes of abdominal pain, gas, bloating, nausea and vomiting for the past year. No clear etiology was determined.  Current Medications, Allergies, Past Medical History, Past Surgical History, Family History and Social History were reviewed in [employer] record.  Physical Exam: General: Well developed , well nourished, no acute distress Head: Normocephalic and atraumatic Eyes:  sclerae anicteric, EOMI Ears: Normal auditory acuity Mouth: No deformity or lesions Lungs: Clear throughout to auscultation Heart: Regular rate and rhythm; no murmurs, rubs or bruits Abdomen: Soft, non tender and non distended. No masses, hepatosplenomegaly noted. Umbilical hernia, not reduced Normal Bowel sounds Musculoskeletal: Symmetrical with no gross deformities  Pulses:  Normal pulses noted Extremities: No clubbing, cyanosis, edema or deformities noted Neurological: Alert oriented x 4, grossly nonfocal Psychological:  Alert and cooperative. Normal mood and affect  Assessment and Recommendations:  1. GERD. Continue omeprazole 40 mg daily and standard antireflux measures.  2. Umbilical hernia. Surgical referral was again recommended. He states she will pursue this later this year.  3. Personal history of adenomatous colon polyps. Surveillance colonoscopy recommended August 2016.

## 2013-02-20 NOTE — Patient Instructions (Signed)
We have sent the following medications to your mail order pharmacy for you: Omeprazole.   Please contact your surgeon regarding your umbilical hernia repair.   Thank you for choosing me and [facility].  Pricilla Riffle. Dagoberto Ligas., MD., Marval Regal

## 2013-04-02 ENCOUNTER — Other Ambulatory Visit: Payer: Self-pay | Admitting: Family Medicine

## 2013-04-03 NOTE — Telephone Encounter (Signed)
Med filled.  

## 2013-05-06 ENCOUNTER — Other Ambulatory Visit: Payer: Self-pay | Admitting: Family Medicine

## 2013-05-06 NOTE — Telephone Encounter (Signed)
Med filled.  

## 2013-06-07 ENCOUNTER — Other Ambulatory Visit: Payer: Self-pay | Admitting: Family Medicine

## 2013-06-07 NOTE — Telephone Encounter (Signed)
Med filled and letter mailed to pt.  

## 2013-06-20 ENCOUNTER — Other Ambulatory Visit: Payer: Self-pay | Admitting: Family Medicine

## 2013-06-20 NOTE — Telephone Encounter (Signed)
Med filled.  

## 2013-08-05 ENCOUNTER — Other Ambulatory Visit: Payer: Self-pay | Admitting: Family Medicine

## 2013-08-06 NOTE — Telephone Encounter (Signed)
Medication filled and letter mailed to pt to inform need for appt.

## 2013-08-20 ENCOUNTER — Other Ambulatory Visit: Payer: Self-pay | Admitting: General Practice

## 2013-08-20 MED ORDER — METFORMIN HCL 1000 MG PO TABS
ORAL_TABLET | ORAL | Status: DC
Start: 2013-08-20 — End: 2013-09-19

## 2013-08-20 NOTE — Telephone Encounter (Signed)
Spoke with pt, states he has been unable to come in for an appt. Med filled and letter mailed to make sure he schedules appt.

## 2013-08-28 ENCOUNTER — Other Ambulatory Visit: Payer: Self-pay | Admitting: General Practice

## 2013-08-28 MED ORDER — SIMVASTATIN 40 MG PO TABS: ORAL_TABLET | ORAL | Status: DC

## 2013-08-28 MED ORDER — FENOFIBRATE 160 MG PO TABS: ORAL_TABLET | ORAL | Status: DC

## 2013-09-19 ENCOUNTER — Other Ambulatory Visit: Payer: Self-pay | Admitting: Family Medicine

## 2013-09-19 NOTE — Telephone Encounter (Signed)
Last refill until appt made, 2nd letter mailed to pt.

## 2013-10-27 ENCOUNTER — Other Ambulatory Visit: Payer: Self-pay | Admitting: Family Medicine

## 2013-10-28 NOTE — Telephone Encounter (Signed)
Med filled and letter mailed to pt to schedule an appt.  

## 2013-10-31 ENCOUNTER — Other Ambulatory Visit: Payer: Self-pay | Admitting: Family Medicine

## 2013-10-31 NOTE — Telephone Encounter (Signed)
Called pt and lmovm informing the need to schedule an appt. Can mail in #30 to a local pharmacy but will not fill #90 until pt is seen.

## 2013-10-31 NOTE — Telephone Encounter (Signed)
Last OV 07-04-12, no upcoming appts.  Med filled 08-28-13 #90 with 0  Letter mailed on 10-28-13 to schedule appt.

## 2013-10-31 NOTE — Telephone Encounter (Signed)
Export for #30, pt needs appt.  Please explain why we can't send to mail order

## 2013-11-04 NOTE — Telephone Encounter (Signed)
Med filled and faxed.  

## 2013-11-08 ENCOUNTER — Other Ambulatory Visit: Payer: Self-pay

## 2013-11-12 ENCOUNTER — Other Ambulatory Visit: Payer: Self-pay | Admitting: Family Medicine

## 2013-11-12 NOTE — Telephone Encounter (Signed)
Last OV 06-2012. Pt has been sent 4 letters to make appt (none scheduled)

## 2013-11-24 LAB — HM DIABETES EYE EXAM

## 2013-12-02 ENCOUNTER — Ambulatory Visit (INDEPENDENT_AMBULATORY_CARE_PROVIDER_SITE_OTHER): Payer: 59 | Admitting: Family Medicine

## 2013-12-02 ENCOUNTER — Encounter: Payer: Self-pay | Admitting: Family Medicine

## 2013-12-02 VITALS — BP 124/76 | HR 72 | Temp 98.1°F | Resp 16 | Wt 205.0 lb

## 2013-12-02 DIAGNOSIS — E119 Type 2 diabetes mellitus without complications: Secondary | ICD-10-CM

## 2013-12-02 DIAGNOSIS — E1169 Type 2 diabetes mellitus with other specified complication: Secondary | ICD-10-CM

## 2013-12-02 DIAGNOSIS — E785 Hyperlipidemia, unspecified: Secondary | ICD-10-CM

## 2013-12-02 DIAGNOSIS — I10 Essential (primary) hypertension: Secondary | ICD-10-CM

## 2013-12-02 NOTE — Progress Notes (Signed)
   Subjective:    Patient ID: Pedro [name], male    DOB: 28-May-1955, 58 y.o.   MRN: [id number]  HPI DM- chronic problem, on Metformin.  On ACE for renal protection.  Due for eye exam this month- plans to schedule.  + glucosuria on DOT physical.  Diet is limited by what job provides.  CBGs 90-133.  Denies symptomatic lows.  No numbness/tingling of hands/feet.    HTN- chronic problem, on Lisinopril HCTZ.  Denies CP, SOB, HAs, visual changes, edema.  Hyperlipidemia- chronic problem, on Simvastatin, Fenofibrate.  Denies abd pain, N/V, myalgias   Review of Systems For ROS see HPI     Objective:   Physical Exam  Constitutional: He is oriented to person, place, and time. He appears well-developed and well-nourished. No distress.  HENT:  Head: Normocephalic and atraumatic.  Eyes: Conjunctivae and EOM are normal. Pupils are equal, round, and reactive to light.  Neck: Normal range of motion. Neck supple. No thyromegaly present.  Cardiovascular: Normal rate, regular rhythm, normal heart sounds and intact distal pulses.   No murmur heard. Pulmonary/Chest: Effort normal and breath sounds normal. No respiratory distress.  Abdominal: Soft. Bowel sounds are normal. He exhibits no distension.  Musculoskeletal: He exhibits no edema.  Lymphadenopathy:    He has no cervical adenopathy.  Neurological: He is alert and oriented to person, place, and time. No cranial nerve deficit.  Skin: Skin is warm and dry.  Psychiatric: He has a normal mood and affect. His behavior is normal.  Vitals reviewed.         Assessment & Plan:

## 2013-12-02 NOTE — Progress Notes (Signed)
Pre visit review using our clinic review tool, if applicable. No additional management support is needed unless otherwise documented below in the visit note. 

## 2013-12-02 NOTE — Patient Instructions (Signed)
Follow up in 3-4 months to recheck diabetes We'll notify you of your lab results and make any changes if needed Keep up the good work on healthy diet and regular exercise Call with any questions or concerns HAPPY HOLIDAYS!!!

## 2013-12-03 ENCOUNTER — Telehealth: Payer: Self-pay | Admitting: Family Medicine

## 2013-12-03 LAB — CBC WITH DIFFERENTIAL/PLATELET
BASOS PCT: 0.7 % (ref 0.0–3.0)
Basophils Absolute: 0 10*3/uL (ref 0.0–0.1)
Eosinophils Absolute: 0.1 10*3/uL (ref 0.0–0.7)
Eosinophils Relative: 1.9 % (ref 0.0–5.0)
HCT: 48.9 % (ref 39.0–52.0)
HEMOGLOBIN: 16.3 g/dL (ref 13.0–17.0)
Lymphocytes Relative: 33.7 % (ref 12.0–46.0)
Lymphs Abs: 2.5 10*3/uL (ref 0.7–4.0)
MCHC: 33.4 g/dL (ref 30.0–36.0)
MCV: 92.8 fl (ref 78.0–100.0)
MONOS PCT: 9.6 % (ref 3.0–12.0)
Monocytes Absolute: 0.7 10*3/uL (ref 0.1–1.0)
NEUTROS ABS: 4.1 10*3/uL (ref 1.4–7.7)
Neutrophils Relative %: 54.1 % (ref 43.0–77.0)
Platelets: 274 10*3/uL (ref 150.0–400.0)
RBC: 5.27 Mil/uL (ref 4.22–5.81)
RDW: 12.5 % (ref 11.5–15.5)
WBC: 7.5 10*3/uL (ref 4.0–10.5)

## 2013-12-03 LAB — LIPID PANEL
CHOL/HDL RATIO: 3
Cholesterol: 113 mg/dL (ref 0–200)
HDL: 34.4 mg/dL — ABNORMAL LOW (ref >39.00)
LDL CALC: 53 mg/dL (ref 0–99)
NONHDL: 78.6
Triglycerides: 126 mg/dL (ref 0.0–149.0)
VLDL: 25.2 mg/dL (ref 0.0–40.0)

## 2013-12-03 LAB — BASIC METABOLIC PANEL
BUN: 24 mg/dL — ABNORMAL HIGH (ref 6–23)
CALCIUM: 9.3 mg/dL (ref 8.4–10.5)
CO2: 24 mEq/L (ref 19–32)
Chloride: 102 mEq/L (ref 96–112)
Creatinine, Ser: 1 mg/dL (ref 0.4–1.5)
GFR: 79.98 mL/min (ref >60.00)
Glucose, Bld: 105 mg/dL — ABNORMAL HIGH (ref 70–99)
POTASSIUM: 3.7 meq/L (ref 3.5–5.1)
Sodium: 136 mEq/L (ref 135–145)

## 2013-12-03 LAB — HEPATIC FUNCTION PANEL
ALBUMIN: 3.9 g/dL (ref 3.5–5.2)
ALK PHOS: 26 U/L — AB (ref 39–117)
ALT: 24 U/L (ref 0–53)
AST: 28 U/L (ref 0–37)
BILIRUBIN DIRECT: 0.2 mg/dL (ref 0.0–0.3)
Total Bilirubin: 1.4 mg/dL — ABNORMAL HIGH (ref 0.2–1.2)
Total Protein: 7.1 g/dL (ref 6.0–8.3)

## 2013-12-03 LAB — TSH: TSH: 1.94 u[IU]/mL (ref 0.35–4.50)

## 2013-12-03 LAB — HEMOGLOBIN A1C: Hgb A1c MFr Bld: 7.6 % — ABNORMAL HIGH (ref 4.6–6.5)

## 2013-12-03 MED ORDER — METFORMIN HCL 1000 MG PO TABS: ORAL_TABLET | ORAL | Status: DC

## 2013-12-03 NOTE — Assessment & Plan Note (Signed)
Chronic problem.  Tolerating meds w/o difficulty.  Check labs.  Adjust meds prn  

## 2013-12-03 NOTE — Telephone Encounter (Signed)
Medication filled.  

## 2013-12-03 NOTE — Assessment & Plan Note (Signed)
Chronic problem.  Well controlled.  Asymptomatic.  Check labs.  No anticipated med changes. 

## 2013-12-03 NOTE — Telephone Encounter (Signed)
Caller name: dina Relation to pt: wife Call back number: [phone] Pharmacy: [facility]  Reason for call:   Patient wife states that the only medication that needs to be called in to local pharmacy is the metformin. She states that he should have enough of the other medication to last him until the mail order comes in.

## 2013-12-03 NOTE — Assessment & Plan Note (Signed)
Chronic problem.  Pt is tolerating meds w/o difficulty.  Is working on low carb diet when he is able to control his meals.  Due for eye exam this month.  On ACE for renal protection.  Check labs.  Adjust meds prn

## 2013-12-04 ENCOUNTER — Encounter: Payer: Self-pay | Admitting: General Practice

## 2013-12-04 ENCOUNTER — Telehealth: Payer: Self-pay | Admitting: Family Medicine

## 2013-12-04 NOTE — Telephone Encounter (Signed)
Pt states that his job is requesting documentation stating that his diabetes is under control and is maintaining, and what the a1c is. Please fax to [facility], [phone].AttnHoyle Sauer. States dr. Birdie Riddle is aware of this. Pt would like confirmation that it was faxed. States [facility] needs today before 5:00pm.

## 2013-12-04 NOTE — Telephone Encounter (Signed)
Ok to provide note indicating that A1C is well controlled and stable at 7.6.  Please fax to # provided.

## 2013-12-04 NOTE — Telephone Encounter (Signed)
Letter printed and faxed at 2:15pm.

## 2013-12-04 NOTE — Telephone Encounter (Signed)
Last A1c was 7.6. Please advise.

## 2013-12-06 ENCOUNTER — Other Ambulatory Visit: Payer: Self-pay | Admitting: Gastroenterology

## 2013-12-10 ENCOUNTER — Other Ambulatory Visit: Payer: Self-pay | Admitting: Family Medicine

## 2013-12-10 ENCOUNTER — Other Ambulatory Visit: Payer: Self-pay | Admitting: Gastroenterology

## 2013-12-11 NOTE — Telephone Encounter (Signed)
Med filled.  

## 2013-12-27 ENCOUNTER — Telehealth: Payer: Self-pay | Admitting: Family Medicine

## 2013-12-27 ENCOUNTER — Other Ambulatory Visit: Payer: Self-pay | Admitting: Gastroenterology

## 2013-12-27 MED ORDER — METFORMIN HCL 1000 MG PO TABS: ORAL_TABLET | ORAL | Status: DC

## 2013-12-27 NOTE — Telephone Encounter (Signed)
Med filled.  

## 2013-12-27 NOTE — Telephone Encounter (Signed)
Caller name: dina Relation to pt: wife Call back number: [phone] Pharm[facility]  Reason for call:   Patient is out of metformin and would like refill called in to pharmacy.

## 2014-01-13 ENCOUNTER — Other Ambulatory Visit: Payer: Self-pay | Admitting: Gastroenterology

## 2014-01-20 ENCOUNTER — Other Ambulatory Visit: Payer: Self-pay | Admitting: Gastroenterology

## 2014-02-02 ENCOUNTER — Other Ambulatory Visit: Payer: Self-pay | Admitting: Gastroenterology

## 2014-02-03 NOTE — Telephone Encounter (Signed)
NEEDS OFFICE VISIT FOR ANY FURTHER REFILLS! 

## 2014-02-06 ENCOUNTER — Telehealth: Payer: Self-pay | Admitting: Family Medicine

## 2014-02-06 ENCOUNTER — Other Ambulatory Visit: Payer: Self-pay | Admitting: Gastroenterology

## 2014-02-06 MED ORDER — METFORMIN HCL 1000 MG PO TABS: ORAL_TABLET | ORAL | Status: DC

## 2014-02-06 NOTE — Telephone Encounter (Signed)
Med filled again at 3:52pm.

## 2014-02-06 NOTE — Telephone Encounter (Signed)
Pharmacy states they did not receive the rx request, please resend

## 2014-02-06 NOTE — Telephone Encounter (Signed)
Med filled to local pharmacy. 

## 2014-02-06 NOTE — Addendum Note (Signed)
Addended by: Kris Hartmann on: 02/06/2014 03:52 PM   Modules accepted: Orders

## 2014-02-06 NOTE — Telephone Encounter (Signed)
Caller name:Amrhein, dina Relation to EZ:[id number] Call back [phone] Pharmacy:[facility]  Reason for call: pt is having trouble with his mail order, and would like to know if dr. Birdie Riddle can call him in a rx for metformin until the mail order is corrected.  Pt only has 2 pills left

## 2014-02-24 ENCOUNTER — Other Ambulatory Visit: Payer: Self-pay | Admitting: Gastroenterology

## 2014-02-24 ENCOUNTER — Other Ambulatory Visit: Payer: Self-pay | Admitting: Family Medicine

## 2014-02-25 NOTE — Telephone Encounter (Signed)
Med filled.  

## 2014-03-14 ENCOUNTER — Other Ambulatory Visit: Payer: Self-pay | Admitting: Family Medicine

## 2014-03-17 NOTE — Telephone Encounter (Signed)
Med filled.  

## 2014-03-24 ENCOUNTER — Other Ambulatory Visit: Payer: Self-pay | Admitting: Family Medicine

## 2014-03-24 MED ORDER — AZITHROMYCIN 250 MG PO TABS: ORAL_TABLET | ORAL | Status: DC

## 2014-03-24 NOTE — Progress Notes (Signed)
Pt is leaving town and has been exposed to strep x2.  Script sent for Zpack to be used if sxs develop.

## 2014-06-09 ENCOUNTER — Other Ambulatory Visit: Payer: Self-pay | Admitting: Family Medicine

## 2014-06-10 NOTE — Telephone Encounter (Signed)
Med filled and letter mailed to pt to schedule a diabetic follow up.

## 2014-07-21 ENCOUNTER — Encounter: Payer: Self-pay | Admitting: Gastroenterology

## 2014-07-21 ENCOUNTER — Other Ambulatory Visit: Payer: Self-pay

## 2014-07-25 ENCOUNTER — Other Ambulatory Visit: Payer: Self-pay | Admitting: Family Medicine

## 2014-08-12 ENCOUNTER — Other Ambulatory Visit: Payer: Self-pay | Admitting: Family Medicine

## 2014-08-12 MED ORDER — FENOFIBRATE 160 MG PO TABS: ORAL_TABLET | ORAL | Status: DC

## 2014-08-12 MED ORDER — SIMVASTATIN 40 MG PO TABS: ORAL_TABLET | ORAL | Status: DC

## 2014-08-12 NOTE — Telephone Encounter (Signed)
meds filled #30 only, pt needs a cholesterol and diabetes follow up for further refills.

## 2014-08-19 ENCOUNTER — Other Ambulatory Visit: Payer: Self-pay | Admitting: Family Medicine

## 2014-09-15 ENCOUNTER — Other Ambulatory Visit: Payer: Self-pay | Admitting: General Practice

## 2014-09-15 MED ORDER — GLUCOSE BLOOD VI STRP: ORAL_STRIP | Status: DC

## 2014-09-15 MED ORDER — FREESTYLE LANCETS MISC
1.0000 | Freq: Three times a day (TID) | Status: DC
Start: 2014-09-15 — End: 2017-09-19

## 2014-10-02 ENCOUNTER — Other Ambulatory Visit: Payer: Self-pay | Admitting: General Practice

## 2014-10-02 MED ORDER — GLUCOSE BLOOD VI STRP
ORAL_STRIP | Status: DC
Start: 2014-10-02 — End: 2015-08-03

## 2014-10-13 ENCOUNTER — Telehealth: Payer: Self-pay | Admitting: Family Medicine

## 2014-10-13 NOTE — Telephone Encounter (Signed)
Ok for any doc of the day appts on Thursday afternoon.

## 2014-10-13 NOTE — Telephone Encounter (Signed)
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 Spouse states PCP advised her she would fit him, patient will be in town Wednesday or Thursday. Please advise

## 2014-10-13 NOTE — Telephone Encounter (Signed)
Scheduled for 10/16/2014 at 1pm

## 2014-10-14 ENCOUNTER — Ambulatory Visit: Payer: 59 | Admitting: Family Medicine

## 2014-10-15 ENCOUNTER — Other Ambulatory Visit: Payer: Self-pay | Admitting: Family Medicine

## 2014-10-15 NOTE — Telephone Encounter (Signed)
Medication filled to pharmacy as requested.   

## 2014-10-16 ENCOUNTER — Ambulatory Visit (INDEPENDENT_AMBULATORY_CARE_PROVIDER_SITE_OTHER): Payer: 59 | Admitting: Family Medicine

## 2014-10-16 ENCOUNTER — Encounter: Payer: Self-pay | Admitting: Family Medicine

## 2014-10-16 VITALS — BP 128/78 | HR 63 | Temp 98.1°F | Resp 16 | Ht 68.0 in | Wt 196.5 lb

## 2014-10-16 DIAGNOSIS — E1169 Type 2 diabetes mellitus with other specified complication: Secondary | ICD-10-CM | POA: Diagnosis not present

## 2014-10-16 DIAGNOSIS — E119 Type 2 diabetes mellitus without complications: Secondary | ICD-10-CM

## 2014-10-16 DIAGNOSIS — Z23 Encounter for immunization: Secondary | ICD-10-CM | POA: Diagnosis not present

## 2014-10-16 DIAGNOSIS — E785 Hyperlipidemia, unspecified: Secondary | ICD-10-CM | POA: Diagnosis not present

## 2014-10-16 DIAGNOSIS — I10 Essential (primary) hypertension: Secondary | ICD-10-CM | POA: Diagnosis not present

## 2014-10-16 NOTE — Assessment & Plan Note (Signed)
Chronic problem.  Tolerating statin and fenofibrate w/o difficulty.  Check labs.  Adjust meds prn  

## 2014-10-16 NOTE — Progress Notes (Signed)
   Subjective:    Patient ID: Pedro [name], male    DOB: Jan 13, 1956, 59 y.o.   MRN: [id number]  HPI DM- chronic problem, on Metformin.  On ACE for renal protection.  UTD on eye exam (November) and has appt scheduled this November.  Pt has lost 10 lbs since last visit.  Pt is limited in his food options b/c he travels to remote locations.  Fasting CBGs when home are 110s.  Denies symptomatic lows.  No numbness/tingling of hands/feet.   HTN- chronic problem, on Lisinopril HCTZ w/ adequate control.  No CP, SOB, HAs, visual changes, edema.  Hyperlipidemia- chronic problem, on Simvastatin and Fenofibrate.  No abd pain, N/V, myalgias.   Review of Systems For ROS see HPI     Objective:   Physical Exam  Constitutional: He is oriented to person, place, and time. He appears well-developed and well-nourished. No distress.  HENT:  Head: Normocephalic and atraumatic.  Eyes: Conjunctivae and EOM are normal. Pupils are equal, round, and reactive to light.  Neck: Normal range of motion. Neck supple. No thyromegaly present.  Cardiovascular: Normal rate, regular rhythm, normal heart sounds and intact distal pulses.   No murmur heard. Pulmonary/Chest: Effort normal and breath sounds normal. No respiratory distress.  Abdominal: Soft. Bowel sounds are normal. He exhibits no distension.  Musculoskeletal: He exhibits no edema.  Lymphadenopathy:    He has no cervical adenopathy.  Neurological: He is alert and oriented to person, place, and time. No cranial nerve deficit.  Skin: Skin is warm and dry.  Psychiatric: He has a normal mood and affect. His behavior is normal.  Vitals reviewed.         Assessment & Plan:

## 2014-10-16 NOTE — Patient Instructions (Signed)
Schedule your complete physical in 3-4 months We'll notify you of your lab results and make any changes if needed Keep up the good work on healthy diet and regular exercise- you look great! Call with any questions or concerns If you want to join [place] at the new [place] office, any scheduled appointments will automatically transfer and we will see you at [address] [place] [address] [name], [place] [address]  Happy Early Pedro [name]!!!

## 2014-10-16 NOTE — Assessment & Plan Note (Signed)
Chronic problem.  Well controlled on current meds.  Asymptomatic at this time.  Check labs.  No anticipated med changes.

## 2014-10-16 NOTE — Assessment & Plan Note (Signed)
Chronic problem.  Pt is not consistent in his f/u due to his job in [place] and [place].  UTD on eye exam.  On ACE for renal protection.  Foot exam done today.  Asymptomatic.  Applauded his efforts at healthy diet and regular exercise along w/ his recent weight loss.  Check labs.  Adjust meds prn

## 2014-10-16 NOTE — Progress Notes (Signed)
Pre visit review using our clinic review tool, if applicable. No additional management support is needed unless otherwise documented below in the visit note. 

## 2014-10-16 NOTE — Addendum Note (Signed)
Addended by: Desmond Dike L on: 10/16/2014 02:00 PM   Modules accepted: Orders

## 2014-10-17 LAB — CBC WITH DIFFERENTIAL/PLATELET
Basophils Absolute: 0.1 10*3/uL (ref 0.0–0.1)
Basophils Relative: 0.9 % (ref 0.0–3.0)
EOS PCT: 2.9 % (ref 0.0–5.0)
Eosinophils Absolute: 0.2 10*3/uL (ref 0.0–0.7)
HCT: 46.3 % (ref 39.0–52.0)
Hemoglobin: 15.6 g/dL (ref 13.0–17.0)
LYMPHS ABS: 2.4 10*3/uL (ref 0.7–4.0)
Lymphocytes Relative: 35.7 % (ref 12.0–46.0)
MCHC: 33.7 g/dL (ref 30.0–36.0)
MCV: 93.5 fl (ref 78.0–100.0)
MONO ABS: 0.7 10*3/uL (ref 0.1–1.0)
Monocytes Relative: 10 % (ref 3.0–12.0)
NEUTROS PCT: 50.5 % (ref 43.0–77.0)
Neutro Abs: 3.4 10*3/uL (ref 1.4–7.7)
PLATELETS: 274 10*3/uL (ref 150.0–400.0)
RBC: 4.96 Mil/uL (ref 4.22–5.81)
RDW: 12.7 % (ref 11.5–15.5)
WBC: 6.7 10*3/uL (ref 4.0–10.5)

## 2014-10-17 LAB — HEPATIC FUNCTION PANEL
ALBUMIN: 4.4 g/dL (ref 3.5–5.2)
ALT: 19 U/L (ref 0–53)
AST: 18 U/L (ref 0–37)
Alkaline Phosphatase: 26 U/L — ABNORMAL LOW (ref 39–117)
BILIRUBIN TOTAL: 0.7 mg/dL (ref 0.2–1.2)
Bilirubin, Direct: 0.2 mg/dL (ref 0.0–0.3)
Total Protein: 7 g/dL (ref 6.0–8.3)

## 2014-10-17 LAB — BASIC METABOLIC PANEL
BUN: 16 mg/dL (ref 6–23)
CHLORIDE: 103 meq/L (ref 96–112)
CO2: 27 mEq/L (ref 19–32)
Calcium: 9.7 mg/dL (ref 8.4–10.5)
Creatinine, Ser: 1.01 mg/dL (ref 0.40–1.50)
GFR: 80.65 mL/min (ref >60.00)
GLUCOSE: 91 mg/dL (ref 70–99)
POTASSIUM: 3.8 meq/L (ref 3.5–5.1)
Sodium: 139 mEq/L (ref 135–145)

## 2014-10-17 LAB — HEMOGLOBIN A1C: Hgb A1c MFr Bld: 7.1 % — ABNORMAL HIGH (ref 4.6–6.5)

## 2014-10-17 LAB — LIPID PANEL
CHOLESTEROL: 108 mg/dL (ref 0–200)
HDL: 39 mg/dL — ABNORMAL LOW (ref >39.00)
LDL Cholesterol: 37 mg/dL (ref 0–99)
NonHDL: 69.11
TRIGLYCERIDES: 159 mg/dL — AB (ref 0.0–149.0)
Total CHOL/HDL Ratio: 3
VLDL: 31.8 mg/dL (ref 0.0–40.0)

## 2014-10-17 LAB — TSH: TSH: 1.18 u[IU]/mL (ref 0.35–4.50)

## 2014-11-21 ENCOUNTER — Ambulatory Visit: Payer: Self-pay | Admitting: Gastroenterology

## 2014-11-24 ENCOUNTER — Ambulatory Visit: Payer: Self-pay | Admitting: Gastroenterology

## 2014-11-26 ENCOUNTER — Encounter: Payer: Self-pay | Admitting: Gastroenterology

## 2014-11-26 ENCOUNTER — Ambulatory Visit (INDEPENDENT_AMBULATORY_CARE_PROVIDER_SITE_OTHER): Payer: 59 | Admitting: Gastroenterology

## 2014-11-26 VITALS — BP 120/68 | HR 68 | Ht 67.5 in | Wt 193.6 lb

## 2014-11-26 DIAGNOSIS — Z86010 Personal history of colonic polyps: Secondary | ICD-10-CM

## 2014-11-26 DIAGNOSIS — K429 Umbilical hernia without obstruction or gangrene: Secondary | ICD-10-CM

## 2014-11-26 DIAGNOSIS — K219 Gastro-esophageal reflux disease without esophagitis: Secondary | ICD-10-CM | POA: Diagnosis not present

## 2014-11-26 MED ORDER — NA SULFATE-K SULFATE-MG SULF 17.5-3.13-1.6 GM/177ML PO SOLN: 1.0000 | Freq: Once | ORAL | Status: DC

## 2014-11-26 NOTE — Progress Notes (Signed)
    History of Present Illness: This is a 58 year old male with a history of tubulovillous adenoma removed in 12/2005. Follow-up colonoscopy in 08/2009 did not show polyps. His reflux symptoms are well-controlled on daily omeprazole. EGD in 2006 did not show esophagitis or Barrett's. He has not yet had his umbilical hernia repair. Symptoms have not changed. Denies weight loss, abdominal pain, constipation, diarrhea, change in stool caliber, melena, hematochezia, nausea, vomiting, dysphagia, reflux symptoms, chest pain.  Current Medications, Allergies, Past Medical History, Past Surgical History, Family History and Social History were reviewed in [employer] record.  Physical Exam: General: Well developed, well nourished, no acute distress Head: Normocephalic and atraumatic Eyes:  sclerae anicteric, EOMI Ears: Normal auditory acuity Mouth: No deformity or lesions Lungs: Clear throughout to auscultation Heart: Regular rate and rhythm; no murmurs, rubs or bruits Abdomen: Soft, non tender and non distended. No masses or hepatosplenomegaly noted. Umbilical hernia. Normal Bowel sounds Rectal: deferred to colonoscopy Musculoskeletal: Symmetrical with no gross deformities  Pulses:  Normal pulses noted Extremities: No clubbing, cyanosis, edema or deformities noted Neurological: Alert oriented x 4, grossly nonfocal Psychological:  Alert and cooperative. Normal mood and affect  Assessment and Recommendations:  1. Umbilical hernia. Advised him to proceed with surgical consultation for consideration of repair.   2. Personal history of tubulovillous adenoma in 2007. He is due for surveillance colonoscopy. The risks (including bleeding, perforation, infection, missed lesions, medication reactions and possible hospitalization or surgery if complications occur), benefits, and alternatives to colonoscopy with possible biopsy and possible polypectomy were discussed with the patient and  they consent to proceed.   3. GERD. Continue omeprazole 40 mg daily and standard antireflux measures.

## 2014-11-26 NOTE — Patient Instructions (Signed)
You have been scheduled for a colonoscopy. Please follow written instructions given to you at your visit today.  Please pick up your prep supplies at the pharmacy within the next 1-3 days. If you use inhalers (even only as needed), please bring them with you on the day of your procedure.  Thank you for choosing me and  Gastroenterology.  Malcolm T. Stark, Jr., MD., FACG  

## 2014-12-18 ENCOUNTER — Other Ambulatory Visit: Payer: Self-pay | Admitting: Family Medicine

## 2015-01-11 ENCOUNTER — Other Ambulatory Visit: Payer: Self-pay | Admitting: Family Medicine

## 2015-01-12 ENCOUNTER — Other Ambulatory Visit: Payer: Self-pay | Admitting: General Practice

## 2015-01-12 MED ORDER — FENOFIBRATE 160 MG PO TABS: 160.0000 mg | ORAL_TABLET | Freq: Every day | ORAL | Status: DC

## 2015-01-12 MED ORDER — OMEPRAZOLE 40 MG PO CPDR: DELAYED_RELEASE_CAPSULE | ORAL | Status: DC

## 2015-01-12 MED ORDER — SIMVASTATIN 40 MG PO TABS: 40.0000 mg | ORAL_TABLET | Freq: Every day | ORAL | Status: DC

## 2015-01-12 NOTE — Telephone Encounter (Signed)
Medication filled to pharmacy as requested.   

## 2015-01-21 ENCOUNTER — Other Ambulatory Visit: Payer: Self-pay | Admitting: Family Medicine

## 2015-01-21 NOTE — Telephone Encounter (Signed)
Medication filled to pharmacy as requested.   

## 2015-02-02 ENCOUNTER — Encounter: Payer: 59 | Admitting: Gastroenterology

## 2015-02-04 ENCOUNTER — Encounter: Payer: 59 | Admitting: Family Medicine

## 2015-03-23 ENCOUNTER — Telehealth: Payer: Self-pay | Admitting: Family Medicine

## 2015-03-23 NOTE — Telephone Encounter (Signed)
lvm for pt, asking about flu vaccination.

## 2015-03-23 NOTE — Telephone Encounter (Signed)
Chart updated to reflect 

## 2015-03-24 ENCOUNTER — Ambulatory Visit (AMBULATORY_SURGERY_CENTER): Payer: 59 | Admitting: Gastroenterology

## 2015-03-24 ENCOUNTER — Encounter: Payer: Self-pay | Admitting: Gastroenterology

## 2015-03-24 VITALS — BP 106/63 | HR 53 | Temp 97.2°F | Resp 10 | Ht 67.0 in | Wt 193.0 lb

## 2015-03-24 DIAGNOSIS — D123 Benign neoplasm of transverse colon: Secondary | ICD-10-CM | POA: Diagnosis not present

## 2015-03-24 DIAGNOSIS — K635 Polyp of colon: Secondary | ICD-10-CM

## 2015-03-24 DIAGNOSIS — D125 Benign neoplasm of sigmoid colon: Secondary | ICD-10-CM | POA: Diagnosis not present

## 2015-03-24 DIAGNOSIS — Z86010 Personal history of colonic polyps: Secondary | ICD-10-CM

## 2015-03-24 LAB — GLUCOSE, CAPILLARY
GLUCOSE-CAPILLARY: 150 mg/dL — AB (ref 65–99)
GLUCOSE-CAPILLARY: 165 mg/dL — AB (ref 65–99)

## 2015-03-24 MED ORDER — SODIUM CHLORIDE 0.9 % IV SOLN: 500.0000 mL | INTRAVENOUS | Status: DC

## 2015-03-24 NOTE — Progress Notes (Signed)
Called to room to assist during endoscopic procedure.  Patient ID and intended procedure confirmed with present staff. Received instructions for my participation in the procedure from the performing physician.  

## 2015-03-24 NOTE — Patient Instructions (Signed)
YOU HAD AN ENDOSCOPIC PROCEDURE TODAY AT THE [facility]:   Refer to the procedure report that was given to you for any specific questions about what was found during the examination.  If the procedure report does not answer your questions, please call your gastroenterologist to clarify.  If you requested that your care partner not be given the details of your procedure findings, then the procedure report has been included in a sealed envelope for you to review at your convenience later.  YOU SHOULD EXPECT: Some feelings of bloating in the abdomen. Passage of more gas than usual.  Walking can help get rid of the air that was put into your GI tract during the procedure and reduce the bloating. If you had a lower endoscopy (such as a colonoscopy or flexible sigmoidoscopy) you may notice spotting of blood in your stool or on the toilet paper. If you underwent a bowel prep for your procedure, you may not have a normal bowel movement for a few days.  Please Note:  You might notice some irritation and congestion in your nose or some drainage.  This is from the oxygen used during your procedure.  There is no need for concern and it should clear up in a day or so.  SYMPTOMS TO REPORT IMMEDIATELY:   Following lower endoscopy (colonoscopy or flexible sigmoidoscopy):  Excessive amounts of blood in the stool  Significant tenderness or worsening of abdominal pains  Swelling of the abdomen that is new, acute  Fever of 100F or higher    For urgent or emergent issues, a gastroenterologist can be reached at any hour by calling [phone].   DIET: Your first meal following the procedure should be a small meal and then it is ok to progress to your normal diet. Heavy or fried foods are harder to digest and may make you feel nauseous or bloated.  Likewise, meals heavy in dairy and vegetables can increase bloating.  Drink plenty of fluids but you should avoid alcoholic beverages for 24  hours.  ACTIVITY:  You should plan to take it easy for the rest of today and you should NOT DRIVE or use heavy machinery until tomorrow (because of the sedation medicines used during the test).    FOLLOW UP: Our staff will call the number listed on your records the next business day following your procedure to check on you and address any questions or concerns that you may have regarding the information given to you following your procedure. If we do not reach you, we will leave a message.  However, if you are feeling well and you are not experiencing any problems, there is no need to return our call.  We will assume that you have returned to your regular daily activities without incident.  If any biopsies were taken you will be contacted by phone or by letter within the next 1-3 weeks.  Please call us at [phone] if you have not heard about the biopsies in 3 weeks.    SIGNATURES/CONFIDENTIALITY: You and/or your care partner have signed paperwork which will be entered into your electronic medical record.  These signatures attest to the fact that that the information above on your After Visit Summary has been reviewed and is understood.  Full responsibility of the confidentiality of this discharge information lies with you and/or your care-partner.   Information on polyps,diverticulosis ,and high fiber diet given to you today 

## 2015-03-24 NOTE — Progress Notes (Signed)
Report to PACU, RN, vss, BBS= Clear.  

## 2015-03-24 NOTE — Op Note (Signed)
[place]  [address]. [place], [address]   COLONOSCOPY PROCEDURE REPORT  PATIENT: Pedro [name], Pedro [name]  MR#: [id number] BIRTHDATE: 1955-06-15 , 33  yrs. old GENDER: male ENDOSCOPIST: Ladene Artist, MD, [facility] PROCEDURE DATE:  03/24/2015 PROCEDURE:   Colonoscopy, surveillance , Colonoscopy with biopsy, and Colonoscopy with snare polypectomy First Screening Colonoscopy - Avg.  risk and is 50 yrs.  old or older - No.  Prior Negative Screening - Now for repeat screening. N/A  History of Adenoma - Now for follow-up colonoscopy & has been > or = to 3 yrs.  Yes hx of adenoma.  Has been 3 or more years since last colonoscopy.  Polyps removed today? 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 MEDICATIONS: Monitored anesthesia care and Propofol 200 mg IV  DESCRIPTION OF PROCEDURE:   After the risks benefits and alternatives of the procedure were thoroughly explained, informed consent was obtained.  The digital rectal exam revealed no abnormalities of the rectum.   The LB [id number]  endoscope was introduced through the anus and advanced to the cecum, which was identified by both the appendix and ileocecal valve. No adverse events experienced.   The quality of the prep was excellent. (Suprep was used)  The instrument was then slowly withdrawn as the colon was fully examined. Estimated blood loss is zero unless otherwise noted in this procedure report.   COLON FINDINGS: Three sessile polyps measuring 5-6 mm in size were found in the sigmoid colon (1) and transverse colon (2). Polypectomies were performed with a cold snare.  The resection was complete, the polyp tissue was completely retrieved and sent to histology.   A sessile polyp measuring 3 mm in size was found in the sigmoid colon.  A polypectomy was performed with cold forceps. The resection was  complete, the polyp tissue was completely retrieved and sent to histology.   There was mild diverticulosis noted in the sigmoid colon.   The examination was otherwise normal. Retroflexed views revealed no abnormalities. The time to cecum = 1.6 Withdrawal time = 14.5   The scope was withdrawn and the procedure completed. COMPLICATIONS: There were no immediate complications.  ENDOSCOPIC IMPRESSION: 1.   Three sessile polyps in the sigmoid colon and transverse colon; polypectomies performed with a cold snare 2.   Sessile polyp in the sigmoid colon; polypectomy performed with cold forceps 3.   Mild diverticulosis in the sigmoid colon  RECOMMENDATIONS: 1.  Await pathology results 2.  High fiber diet with liberal fluid intake. 3.  Repeat Colonoscopy in 5 years.  eSigned:  Ladene Artist, MD, [facility] 03/24/2015 8:27 AM

## 2015-03-25 ENCOUNTER — Telehealth: Payer: Self-pay | Admitting: *Deleted

## 2015-03-25 NOTE — Telephone Encounter (Signed)
  Follow up Call-  Call back number 03/24/2015  Post procedure Call Back phone  # [phone]  Permission to leave phone message Yes     Patient questions:  Do you have a fever, pain , or abdominal swelling? No. Pain Score  0 *  Have you tolerated food without any problems? Yes.    Have you been able to return to your normal activities? Yes.    Do you have any questions about your discharge instructions: Diet   No. Medications  No. Follow up visit  No.  Do you have questions or concerns about your Care? No.  Actions: * If pain score is 4 or above: No action needed, pain <4.

## 2015-03-30 ENCOUNTER — Encounter: Payer: Self-pay | Admitting: Gastroenterology

## 2015-04-12 ENCOUNTER — Other Ambulatory Visit: Payer: Self-pay | Admitting: Family Medicine

## 2015-04-13 NOTE — Telephone Encounter (Signed)
Medication filled to pharmacy as requested.   

## 2015-08-03 ENCOUNTER — Encounter: Payer: Self-pay | Admitting: Family Medicine

## 2015-08-03 ENCOUNTER — Ambulatory Visit (INDEPENDENT_AMBULATORY_CARE_PROVIDER_SITE_OTHER): Payer: 59 | Admitting: Family Medicine

## 2015-08-03 VITALS — BP 138/78 | HR 68 | Temp 98.1°F | Resp 16 | Ht 67.0 in | Wt 200.4 lb

## 2015-08-03 DIAGNOSIS — E785 Hyperlipidemia, unspecified: Secondary | ICD-10-CM | POA: Diagnosis not present

## 2015-08-03 DIAGNOSIS — I10 Essential (primary) hypertension: Secondary | ICD-10-CM | POA: Diagnosis not present

## 2015-08-03 DIAGNOSIS — E1169 Type 2 diabetes mellitus with other specified complication: Secondary | ICD-10-CM

## 2015-08-03 DIAGNOSIS — E119 Type 2 diabetes mellitus without complications: Secondary | ICD-10-CM

## 2015-08-03 LAB — HEPATIC FUNCTION PANEL
ALK PHOS: 25 U/L — AB (ref 39–117)
ALT: 22 U/L (ref 0–53)
AST: 21 U/L (ref 0–37)
Albumin: 4.5 g/dL (ref 3.5–5.2)
BILIRUBIN DIRECT: 0.1 mg/dL (ref 0.0–0.3)
BILIRUBIN TOTAL: 0.8 mg/dL (ref 0.2–1.2)
Total Protein: 6.9 g/dL (ref 6.0–8.3)

## 2015-08-03 LAB — CBC WITH DIFFERENTIAL/PLATELET
BASOS ABS: 0 10*3/uL (ref 0.0–0.1)
Basophils Relative: 0.6 % (ref 0.0–3.0)
EOS PCT: 2.1 % (ref 0.0–5.0)
Eosinophils Absolute: 0.1 10*3/uL (ref 0.0–0.7)
HCT: 47.2 % (ref 39.0–52.0)
Hemoglobin: 16 g/dL (ref 13.0–17.0)
LYMPHS ABS: 2.1 10*3/uL (ref 0.7–4.0)
Lymphocytes Relative: 36.6 % (ref 12.0–46.0)
MCHC: 33.9 g/dL (ref 30.0–36.0)
MCV: 92.6 fl (ref 78.0–100.0)
MONO ABS: 0.6 10*3/uL (ref 0.1–1.0)
Monocytes Relative: 11.3 % (ref 3.0–12.0)
NEUTROS PCT: 49.4 % (ref 43.0–77.0)
Neutro Abs: 2.8 10*3/uL (ref 1.4–7.7)
PLATELETS: 247 10*3/uL (ref 150.0–400.0)
RBC: 5.09 Mil/uL (ref 4.22–5.81)
RDW: 13 % (ref 11.5–15.5)
WBC: 5.7 10*3/uL (ref 4.0–10.5)

## 2015-08-03 LAB — LIPID PANEL
Cholesterol: 152 mg/dL (ref 0–200)
HDL: 37.2 mg/dL — AB (ref >39.00)
NONHDL: 114.65
Total CHOL/HDL Ratio: 4
Triglycerides: 216 mg/dL — ABNORMAL HIGH (ref 0.0–149.0)
VLDL: 43.2 mg/dL — AB (ref 0.0–40.0)

## 2015-08-03 LAB — BASIC METABOLIC PANEL
BUN: 18 mg/dL (ref 6–23)
CALCIUM: 10.2 mg/dL (ref 8.4–10.5)
CO2: 27 mEq/L (ref 19–32)
Chloride: 102 mEq/L (ref 96–112)
Creatinine, Ser: 1.01 mg/dL (ref 0.40–1.50)
GFR: 80.43 mL/min (ref >60.00)
GLUCOSE: 170 mg/dL — AB (ref 70–99)
POTASSIUM: 4.3 meq/L (ref 3.5–5.1)
SODIUM: 137 meq/L (ref 135–145)

## 2015-08-03 LAB — HEMOGLOBIN A1C: HEMOGLOBIN A1C: 7.6 % — AB (ref 4.6–6.5)

## 2015-08-03 LAB — TSH: TSH: 2.21 u[IU]/mL (ref 0.35–4.50)

## 2015-08-03 LAB — LDL CHOLESTEROL, DIRECT: LDL DIRECT: 87 mg/dL

## 2015-08-03 MED ORDER — LISINOPRIL-HYDROCHLOROTHIAZIDE 20-12.5 MG PO TABS: 1.0000 | ORAL_TABLET | Freq: Every day | ORAL | Status: DC

## 2015-08-03 NOTE — Assessment & Plan Note (Signed)
Chronic problem.  BP is rising when compared to previous.  Pt reports he can feel this elevation when it occurs.  Based on this, will increase Lisinopril to 20mg  daily and monitor for improvement.  Pt expressed understanding and is in agreement w/ plan.

## 2015-08-03 NOTE — Assessment & Plan Note (Signed)
Chronic problem.  Tolerating statin w/o difficulty.  Check labs.  Adjust meds prn  

## 2015-08-03 NOTE — Progress Notes (Signed)
Pre visit review using our clinic review tool, if applicable. No additional management support is needed unless otherwise documented below in the visit note. 

## 2015-08-03 NOTE — Assessment & Plan Note (Signed)
Chronic problem.  Pt reports adequate blood sugar control- denies symptomatic lows.  On ACE for renal protection.  Due for eye exam- pt encouraged to schedule.  Foot exam done today.  Check labs.  Adjust meds prn

## 2015-08-03 NOTE — Patient Instructions (Signed)
Schedule your complete physical in 3-4 months We'll notify you of your lab results and make any changes if needed Continue to work on healthy diet and regular exercise- you look great! Please schedule your eye exam!! Take the current Lisinopril HCTZ until the new dose arrives (it will be Lisinopril 20/HCTZ 12.5mg ) Call with any questions or concerns Have a great summer!!!

## 2015-08-03 NOTE — Progress Notes (Signed)
   Subjective:    Patient ID: Pedro [name], male    DOB: 1955/10/26, 60 y.o.   MRN: [id number]  HPI DM- chronic problem, on Metformin twice [name].  UTD on foot exam.  Due for eye exam- pt sees Dr Pedro [name].  Pt has gained 7 lbs since last visit.  Fasting CBGs are 150s but during the day are 80-110s.  Denies symptomatic lows.  No numbness/tingling of hands/feet.  HTN- chronic problem, adequate control on Lisinopril HCTZ [name].  Pt feels BP has been elevated recently.  No CP, SOB, HAs, visual changes, edema.  Hyperlipidemia- chronic problem, on Simvastatin and Fenofibrate [name].  Denies abd pain, N/V, myalgias.   Review of Systems For ROS see HPI     Objective:   Physical Exam  Constitutional: He is oriented to person, place, and time. He appears well-developed and well-nourished. No distress.  HENT:  Head: Normocephalic and atraumatic.  Eyes: Conjunctivae and EOM are normal. Pupils are equal, round, and reactive to light.  Neck: Normal range of motion. Neck supple. No thyromegaly present.  Cardiovascular: Normal rate, regular rhythm, normal heart sounds and intact distal pulses.   No murmur heard. Pulmonary/Chest: Effort normal and breath sounds normal. No respiratory distress.  Abdominal: Soft. Bowel sounds are normal. He exhibits no distension.  Musculoskeletal: He exhibits no edema.  Lymphadenopathy:    He has no cervical adenopathy.  Neurological: He is alert and oriented to person, place, and time. No cranial nerve deficit.  Skin: Skin is warm and dry.  Psychiatric: He has a normal mood and affect. His behavior is normal.  Vitals reviewed.         Assessment & Plan:

## 2015-08-23 ENCOUNTER — Other Ambulatory Visit: Payer: Self-pay | Admitting: Family Medicine

## 2015-10-14 ENCOUNTER — Telehealth: Payer: Self-pay | Admitting: Family Medicine

## 2015-10-14 NOTE — Telephone Encounter (Signed)
Called pt wife and left a detailed message to inform

## 2015-10-14 NOTE — Telephone Encounter (Signed)
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 When I told wife that she stated that she would call pt back and ask what he needed done. Wife has called back asking if pt can have labs for an A1c check only. Please advise.

## 2015-10-14 NOTE — Telephone Encounter (Signed)
Pt is ok to wait until next month for his A1C b/c anything that needs to be done for DOT or pre-employment physical (including diabetes).  No need for him to be seen today as he is up to date on A1C as of July

## 2015-11-19 ENCOUNTER — Encounter: Payer: 59 | Admitting: Family Medicine

## 2016-01-28 ENCOUNTER — Other Ambulatory Visit: Payer: Self-pay | Admitting: Family Medicine

## 2016-01-28 ENCOUNTER — Telehealth: Payer: Self-pay | Admitting: Family Medicine

## 2016-01-28 MED ORDER — LISINOPRIL-HYDROCHLOROTHIAZIDE 20-12.5 MG PO TABS: 1.0000 | ORAL_TABLET | Freq: Every day | ORAL | 0 refills | Status: DC

## 2016-01-28 NOTE — Telephone Encounter (Signed)
Called and made wife aware that appt is needed, she stated an understanding that pt has just started a new job and she will call back when she finds out his schedule.

## 2016-01-28 NOTE — Telephone Encounter (Signed)
Wife states that pt needs refill on lisinopril, a 30 day supply be sent to [facility] and a refill on 90 day supply be sent to [facility]. Pt will be out before mail order will received.

## 2016-01-28 NOTE — Telephone Encounter (Signed)
Cannot fill 90 day prescription to mail order without pt making a diabetes follow up. He is overdue for a Physical, I will settle for the pt coming in for Diabetic follow up instead.    30 day supply was sent to local pharmacy

## 2016-01-29 NOTE — Telephone Encounter (Signed)
30 is appropriate until he has appt.  No 90 day supply at this time

## 2016-01-29 NOTE — Telephone Encounter (Signed)
Please advise, this medication was filled yesterday #30 with 0 to local pharmacy.   Last OV 08/03/15 Hypertension and DM Lisinopril-HCTZ 01/28/16 #30 with 0

## 2016-03-31 ENCOUNTER — Other Ambulatory Visit: Payer: Self-pay | Admitting: Family Medicine

## 2016-05-03 ENCOUNTER — Telehealth: Payer: Self-pay | Admitting: Family Medicine

## 2016-05-03 MED ORDER — FENOFIBRATE 160 MG PO TABS: 160.0000 mg | ORAL_TABLET | Freq: Every day | ORAL | 0 refills | Status: DC

## 2016-05-03 MED ORDER — METFORMIN HCL 1000 MG PO TABS: 1000.0000 mg | ORAL_TABLET | Freq: Two times a day (BID) | ORAL | 0 refills | Status: DC

## 2016-05-03 MED ORDER — OMEPRAZOLE 40 MG PO CPDR: 40.0000 mg | DELAYED_RELEASE_CAPSULE | Freq: Every day | ORAL | 0 refills | Status: DC

## 2016-05-03 MED ORDER — LISINOPRIL-HYDROCHLOROTHIAZIDE 20-12.5 MG PO TABS: 1.0000 | ORAL_TABLET | Freq: Every day | ORAL | 0 refills | Status: DC

## 2016-05-03 MED ORDER — SIMVASTATIN 40 MG PO TABS: 40.0000 mg | ORAL_TABLET | Freq: Every day | ORAL | 0 refills | Status: DC

## 2016-05-03 NOTE — Telephone Encounter (Signed)
All medications were filled for #30 with 0 until pt comes for appt.

## 2016-05-03 NOTE — Telephone Encounter (Signed)
Wife called and states that pt needs refill on all meds, wife schedule appt for pt on 4/16, but states that pt is out of meds. [facility]

## 2016-05-09 ENCOUNTER — Encounter: Payer: Self-pay | Admitting: Family Medicine

## 2016-05-09 ENCOUNTER — Ambulatory Visit (INDEPENDENT_AMBULATORY_CARE_PROVIDER_SITE_OTHER): Payer: 59 | Admitting: Family Medicine

## 2016-05-09 VITALS — BP 124/82 | HR 89 | Temp 98.0°F | Resp 17 | Ht 67.0 in | Wt 201.0 lb

## 2016-05-09 DIAGNOSIS — E119 Type 2 diabetes mellitus without complications: Secondary | ICD-10-CM | POA: Diagnosis not present

## 2016-05-09 DIAGNOSIS — E1169 Type 2 diabetes mellitus with other specified complication: Secondary | ICD-10-CM

## 2016-05-09 DIAGNOSIS — E785 Hyperlipidemia, unspecified: Secondary | ICD-10-CM | POA: Diagnosis not present

## 2016-05-09 DIAGNOSIS — I10 Essential (primary) hypertension: Secondary | ICD-10-CM

## 2016-05-09 LAB — CBC WITH DIFFERENTIAL/PLATELET
BASOS PCT: 0 %
Basophils Absolute: 0 cells/uL (ref 0–200)
EOS ABS: 144 {cells}/uL (ref 15–500)
Eosinophils Relative: 2 %
HCT: 45.8 % (ref 38.5–50.0)
Hemoglobin: 15.7 g/dL (ref 13.2–17.1)
LYMPHS PCT: 35 %
Lymphs Abs: 2520 cells/uL (ref 850–3900)
MCH: 31.9 pg (ref 27.0–33.0)
MCHC: 34.3 g/dL (ref 32.0–36.0)
MCV: 93.1 fL (ref 80.0–100.0)
MONOS PCT: 12 %
MPV: 9.7 fL (ref 7.5–12.5)
Monocytes Absolute: 864 cells/uL (ref 200–950)
NEUTROS PCT: 51 %
Neutro Abs: 3672 cells/uL (ref 1500–7800)
PLATELETS: 279 10*3/uL (ref 140–400)
RBC: 4.92 MIL/uL (ref 4.20–5.80)
RDW: 12.6 % (ref 11.0–15.0)
WBC: 7.2 10*3/uL (ref 3.8–10.8)

## 2016-05-09 LAB — TSH: TSH: 1.85 mIU/L (ref 0.40–4.50)

## 2016-05-09 NOTE — Patient Instructions (Signed)
Schedule your complete physical in 3-4 months We'll notify you of your lab results and make any changes if needed Continue to work on healthy diet and regular exercise- you can do it! Call and schedule your eye exam Call with any questions or concerns Hang in there!!!

## 2016-05-09 NOTE — Assessment & Plan Note (Signed)
Chronic problem.  On Metformin twice daily.  On ACE for renal protection.  Foot exam done today.  Overdue for eye exam- pt plans to schedule.  Stressed need for healthy diet and regular exercise.  Check labs.  Adjust meds prn

## 2016-05-09 NOTE — Assessment & Plan Note (Signed)
Chronic problem.  Adequate control.  Asymptomatic.  Check labs.  No anticipated med changes.  Will follow. 

## 2016-05-09 NOTE — Progress Notes (Signed)
   Subjective:    Patient ID: Pedro [name], male    DOB: [birth year], 61 y.o.   MRN: [id number]  HPI DM- chronic problem, on Metformin.  On ACE for renal protection.  Due for eye exam- pt plans to schedule.  UTD on foot exam.  Weight is stable.  Denies symptomatic lows.  No numbness/tingling of hands/feet.   Home CBGs 'can't get out of the 140s'.    HTN- chronic problem, on Lisinopril HCTZ [name] w/ good control.  No CP, SOB, HAs, visual changes, edema.  Hyperlipidemia- chronic problem, on Fenofibrate and Simvastatin [name].  Denies abd pain, N/V, myalgias.   Review of Systems For ROS see HPI     Objective:   Physical Exam  Constitutional: He is oriented to person, place, and time. He appears well-developed and well-nourished. No distress.  HENT:  Head: Normocephalic and atraumatic.  Eyes: Conjunctivae and EOM are normal. Pupils are equal, round, and reactive to light.  Neck: Normal range of motion. Neck supple. No thyromegaly present.  Cardiovascular: Normal rate, regular rhythm, normal heart sounds and intact distal pulses.   No murmur heard. Pulmonary/Chest: Effort normal and breath sounds normal. No respiratory distress.  Abdominal: Soft. Bowel sounds are normal. He exhibits no distension.  Musculoskeletal: He exhibits no edema.  Lymphadenopathy:    He has no cervical adenopathy.  Neurological: He is alert and oriented to person, place, and time. No cranial nerve deficit.  Skin: Skin is warm and dry.  Psychiatric: He has a normal mood and affect. His behavior is normal.  Vitals reviewed.         Assessment & Plan:

## 2016-05-09 NOTE — Progress Notes (Signed)
Pre visit review using our clinic review tool, if applicable. No additional management support is needed unless otherwise documented below in the visit note. 

## 2016-05-09 NOTE — Assessment & Plan Note (Signed)
Chronic problem, tolerating statin and fenofibrate w/ difficulty.  Stressed need for healthy diet and regular exercise.  Check labs.  Adjust meds prn

## 2016-05-10 LAB — HEPATIC FUNCTION PANEL
ALBUMIN: 4.3 g/dL (ref 3.6–5.1)
ALT: 25 U/L (ref 9–46)
AST: 21 U/L (ref 10–35)
Alkaline Phosphatase: 26 U/L — ABNORMAL LOW (ref 40–115)
Bilirubin, Direct: 0.2 mg/dL (ref <=0.2)
Indirect Bilirubin: 0.6 mg/dL (ref 0.2–1.2)
TOTAL PROTEIN: 6.8 g/dL (ref 6.1–8.1)
Total Bilirubin: 0.8 mg/dL (ref 0.2–1.2)

## 2016-05-10 LAB — LIPID PANEL
CHOLESTEROL: 117 mg/dL (ref <200)
HDL: 37 mg/dL — AB (ref >40)
LDL Cholesterol: 52 mg/dL (ref <100)
TRIGLYCERIDES: 138 mg/dL (ref <150)
Total CHOL/HDL Ratio: 3.2 Ratio (ref <5.0)
VLDL: 28 mg/dL (ref <30)

## 2016-05-10 LAB — BASIC METABOLIC PANEL
BUN: 14 mg/dL (ref 7–25)
CALCIUM: 9.8 mg/dL (ref 8.6–10.3)
CO2: 25 mmol/L (ref 20–31)
CREATININE: 0.94 mg/dL (ref 0.70–1.33)
Chloride: 103 mmol/L (ref 98–110)
GLUCOSE: 116 mg/dL — AB (ref 65–99)
Potassium: 4.4 mmol/L (ref 3.5–5.3)
Sodium: 140 mmol/L (ref 135–146)

## 2016-05-10 LAB — HEMOGLOBIN A1C
HEMOGLOBIN A1C: 7.4 % — AB (ref <5.7)
MEAN PLASMA GLUCOSE: 166 mg/dL

## 2016-05-27 ENCOUNTER — Other Ambulatory Visit: Payer: Self-pay | Admitting: Family Medicine

## 2016-09-29 ENCOUNTER — Encounter: Payer: Self-pay | Admitting: General Practice

## 2016-09-29 ENCOUNTER — Ambulatory Visit (INDEPENDENT_AMBULATORY_CARE_PROVIDER_SITE_OTHER): Payer: 59 | Admitting: Family Medicine

## 2016-09-29 ENCOUNTER — Encounter: Payer: Self-pay | Admitting: Family Medicine

## 2016-09-29 VITALS — BP 118/81 | HR 70 | Temp 98.1°F | Resp 16 | Ht 67.0 in | Wt 190.1 lb

## 2016-09-29 DIAGNOSIS — Z0000 Encounter for general adult medical examination without abnormal findings: Secondary | ICD-10-CM

## 2016-09-29 DIAGNOSIS — E119 Type 2 diabetes mellitus without complications: Secondary | ICD-10-CM

## 2016-09-29 DIAGNOSIS — Z125 Encounter for screening for malignant neoplasm of prostate: Secondary | ICD-10-CM | POA: Diagnosis not present

## 2016-09-29 LAB — BASIC METABOLIC PANEL
BUN: 27 mg/dL — ABNORMAL HIGH (ref 6–23)
CHLORIDE: 102 meq/L (ref 96–112)
CO2: 30 mEq/L (ref 19–32)
Calcium: 9.9 mg/dL (ref 8.4–10.5)
Creatinine, Ser: 1.34 mg/dL (ref 0.40–1.50)
GFR: 57.81 mL/min — AB (ref >60.00)
GLUCOSE: 217 mg/dL — AB (ref 70–99)
POTASSIUM: 4.2 meq/L (ref 3.5–5.1)
SODIUM: 139 meq/L (ref 135–145)

## 2016-09-29 LAB — CBC WITH DIFFERENTIAL/PLATELET
BASOS PCT: 0.4 % (ref 0.0–3.0)
Basophils Absolute: 0 10*3/uL (ref 0.0–0.1)
EOS ABS: 0.1 10*3/uL (ref 0.0–0.7)
Eosinophils Relative: 1.7 % (ref 0.0–5.0)
HEMATOCRIT: 45.7 % (ref 39.0–52.0)
HEMOGLOBIN: 15.6 g/dL (ref 13.0–17.0)
LYMPHS ABS: 2.3 10*3/uL (ref 0.7–4.0)
LYMPHS PCT: 32.6 % (ref 12.0–46.0)
MCHC: 34.1 g/dL (ref 30.0–36.0)
MCV: 94.5 fl (ref 78.0–100.0)
MONOS PCT: 9.8 % (ref 3.0–12.0)
Monocytes Absolute: 0.7 10*3/uL (ref 0.1–1.0)
Neutro Abs: 4 10*3/uL (ref 1.4–7.7)
Neutrophils Relative %: 55.5 % (ref 43.0–77.0)
Platelets: 289 10*3/uL (ref 150.0–400.0)
RBC: 4.83 Mil/uL (ref 4.22–5.81)
RDW: 12.5 % (ref 11.5–15.5)
WBC: 7.1 10*3/uL (ref 4.0–10.5)

## 2016-09-29 LAB — HEPATIC FUNCTION PANEL
ALK PHOS: 21 U/L — AB (ref 39–117)
ALT: 23 U/L (ref 0–53)
AST: 22 U/L (ref 0–37)
Albumin: 4.7 g/dL (ref 3.5–5.2)
BILIRUBIN DIRECT: 0.1 mg/dL (ref 0.0–0.3)
TOTAL PROTEIN: 7 g/dL (ref 6.0–8.3)
Total Bilirubin: 0.6 mg/dL (ref 0.2–1.2)

## 2016-09-29 LAB — LIPID PANEL
CHOLESTEROL: 123 mg/dL (ref 0–200)
HDL: 41.2 mg/dL (ref >39.00)
LDL CALC: 51 mg/dL (ref 0–99)
NonHDL: 82.02
TRIGLYCERIDES: 156 mg/dL — AB (ref 0.0–149.0)
Total CHOL/HDL Ratio: 3
VLDL: 31.2 mg/dL (ref 0.0–40.0)

## 2016-09-29 LAB — PSA: PSA: 0.35 ng/mL (ref 0.10–4.00)

## 2016-09-29 LAB — TSH: TSH: 2.09 u[IU]/mL (ref 0.35–4.50)

## 2016-09-29 LAB — HEMOGLOBIN A1C: Hgb A1c MFr Bld: 7.3 % — ABNORMAL HIGH (ref 4.6–6.5)

## 2016-09-29 NOTE — Progress Notes (Signed)
   Subjective:    Patient ID: Gwenith Daily, male    DOB: 11-23-1955, 61 y.o.   MRN: [id number]  HPI CPE- UTD on colonoscopy.  Due for eye exam- pt is trying to schedule.  UTD on foot exam.  On ACE for renal protection.  Wants to wait on flu shot.  Pt has lost another 11 lbs!   Review of Systems Patient reports no vision/hearing changes, anorexia, fever ,adenopathy, persistant/recurrent hoarseness, swallowing issues, chest pain, palpitations, edema, persistant/recurrent cough, hemoptysis, dyspnea (rest,exertional, paroxysmal nocturnal), gastrointestinal  bleeding (melena, rectal bleeding), abdominal pain, excessive heart burn, GU symptoms (dysuria, hematuria, voiding/incontinence issues) syncope, focal weakness, memory loss, numbness & tingling, skin/hair/nail changes, depression, anxiety, abnormal bruising/bleeding, musculoskeletal symptoms/signs.     Objective:   Physical Exam BP 118/81   Pulse 70   Temp 98.1 F (36.7 C) (Oral)   Resp 16   Ht 5\' 7"  (1.702 m)   Wt 190 lb 2 oz (86.2 kg)   SpO2 98%   BMI 29.78 kg/m   General Appearance:    Alert, cooperative, no distress, appears stated age  Head:    Normocephalic, without obvious abnormality, atraumatic  Eyes:    PERRL, conjunctiva/corneas clear, EOM's intact, fundi    benign, both eyes       Ears:    Normal TM's and external ear canals, both ears  Nose:   Nares normal, septum midline, mucosa normal, no drainage   or sinus tenderness  Throat:   Lips, mucosa, and tongue normal; teeth and gums normal  Neck:   Supple, symmetrical, trachea midline, no adenopathy;       thyroid:  No enlargement/tenderness/nodules  Back:     Symmetric, no curvature, ROM normal, no CVA tenderness  Lungs:     Clear to auscultation bilaterally, respirations unlabored  Chest wall:    No tenderness or deformity  Heart:    Regular rate and rhythm, S1 and S2 normal, no murmur, rub   or gallop  Abdomen:     Soft, non-tender, bowel sounds active all four  quadrants,    no masses, no organomegaly.  + umbilical hernia  Genitalia:    Normal male without lesion, discharge or tenderness  Rectal:    Normal tone, normal prostate, no masses or tenderness  Extremities:   Extremities normal, atraumatic, no cyanosis or edema  Pulses:   2+ and symmetric all extremities  Skin:   Skin color, texture, turgor normal, no rashes or lesions  Lymph nodes:   Cervical, supraclavicular, and axillary nodes normal  Neurologic:   CNII-XII intact. Normal strength, sensation and reflexes      throughout          Assessment & Plan:

## 2016-09-29 NOTE — Patient Instructions (Signed)
Follow up in 3-4 months to recheck diabetes We'll notify you of your lab results and make any changes if needed Continue to work on healthy diet and regular exercise- you're doing great! Call and schedule your eye exam Call with any questions or concerns Happy Fall!!!

## 2016-09-29 NOTE — Assessment & Plan Note (Signed)
Pt's PE WNL w/ exception of known umbilical hernia.  UTD on immunizations.  Check labs.  Anticipatory guidance provided.

## 2016-09-29 NOTE — Assessment & Plan Note (Signed)
Chronic problem.  UTD on foot exam, on ACE for renal protection.  Due for eye exam- pt to schedule.  Check labs.  Adjust meds prn

## 2016-09-29 NOTE — Progress Notes (Signed)
Pre visit review using our clinic review tool, if applicable. No additional management support is needed unless otherwise documented below in the visit note. 

## 2016-11-08 ENCOUNTER — Other Ambulatory Visit: Payer: Self-pay | Admitting: Family Medicine

## 2016-11-09 ENCOUNTER — Other Ambulatory Visit: Payer: Self-pay | Admitting: Family Medicine

## 2017-01-28 ENCOUNTER — Other Ambulatory Visit: Payer: Self-pay | Admitting: Family Medicine

## 2017-02-03 ENCOUNTER — Ambulatory Visit: Payer: 59 | Admitting: Family Medicine

## 2017-02-03 DIAGNOSIS — Z0289 Encounter for other administrative examinations: Secondary | ICD-10-CM

## 2017-02-20 ENCOUNTER — Other Ambulatory Visit: Payer: Self-pay | Admitting: Family Medicine

## 2017-03-08 ENCOUNTER — Encounter: Payer: Self-pay | Admitting: General Practice

## 2017-04-18 ENCOUNTER — Other Ambulatory Visit: Payer: Self-pay

## 2017-04-18 ENCOUNTER — Emergency Department (HOSPITAL_COMMUNITY)
Admission: EM | Admit: 2017-04-18 | Discharge: 2017-04-19 | Disposition: A | Discharge status: Home / Self Care | Payer: 59 | Attending: Emergency Medicine | Admitting: Emergency Medicine

## 2017-04-18 ENCOUNTER — Encounter (HOSPITAL_COMMUNITY): Payer: Self-pay

## 2017-04-18 DIAGNOSIS — Z5321 Procedure and treatment not carried out due to patient leaving prior to being seen by health care provider: Secondary | ICD-10-CM | POA: Insufficient documentation

## 2017-04-18 DIAGNOSIS — M542 Cervicalgia: Secondary | ICD-10-CM | POA: Diagnosis not present

## 2017-04-18 DIAGNOSIS — S139XXA Sprain of joints and ligaments of unspecified parts of neck, initial encounter: Secondary | ICD-10-CM | POA: Diagnosis not present

## 2017-04-18 DIAGNOSIS — M25511 Pain in right shoulder: Secondary | ICD-10-CM | POA: Diagnosis not present

## 2017-04-18 NOTE — ED Notes (Signed)
Pt has decided to leave 

## 2017-04-18 NOTE — ED Triage Notes (Addendum)
Pt endorses falling from the back of his truck yesterday and injured his neck. Pt sent by [facility] for MRI due to "one of the vertebra in my neck was protruding." Denies numbness, tingling or incontinence. Pt ambulatory. VSS. Did not hit head and no LOC.

## 2017-04-19 ENCOUNTER — Other Ambulatory Visit: Payer: Self-pay | Admitting: Orthopedic Surgery

## 2017-04-19 DIAGNOSIS — M542 Cervicalgia: Secondary | ICD-10-CM

## 2017-04-20 ENCOUNTER — Inpatient Hospital Stay (HOSPITAL_COMMUNITY)

## 2017-04-20 ENCOUNTER — Ambulatory Visit (HOSPITAL_COMMUNITY)
Admission: AD | Admit: 2017-04-20 | Discharge: 2017-04-23 | Disposition: A | Discharge status: Home / Self Care | Source: Ambulatory Visit | Attending: Neurological Surgery | Admitting: Neurological Surgery

## 2017-04-20 ENCOUNTER — Other Ambulatory Visit: Payer: Self-pay

## 2017-04-20 ENCOUNTER — Encounter (HOSPITAL_COMMUNITY): Payer: Self-pay | Admitting: *Deleted

## 2017-04-20 ENCOUNTER — Other Ambulatory Visit: Payer: Self-pay | Admitting: Neurological Surgery

## 2017-04-20 DIAGNOSIS — E119 Type 2 diabetes mellitus without complications: Secondary | ICD-10-CM | POA: Diagnosis not present

## 2017-04-20 DIAGNOSIS — S12600A Unspecified displaced fracture of seventh cervical vertebra, initial encounter for closed fracture: Secondary | ICD-10-CM | POA: Insufficient documentation

## 2017-04-20 DIAGNOSIS — K219 Gastro-esophageal reflux disease without esophagitis: Secondary | ICD-10-CM | POA: Diagnosis not present

## 2017-04-20 DIAGNOSIS — Z880 Allergy status to penicillin: Secondary | ICD-10-CM | POA: Diagnosis not present

## 2017-04-20 DIAGNOSIS — Z981 Arthrodesis status: Secondary | ICD-10-CM

## 2017-04-20 DIAGNOSIS — S134XXA Sprain of ligaments of cervical spine, initial encounter: Secondary | ICD-10-CM | POA: Diagnosis not present

## 2017-04-20 DIAGNOSIS — E785 Hyperlipidemia, unspecified: Secondary | ICD-10-CM | POA: Insufficient documentation

## 2017-04-20 DIAGNOSIS — Z7982 Long term (current) use of aspirin: Secondary | ICD-10-CM | POA: Insufficient documentation

## 2017-04-20 DIAGNOSIS — Z91012 Allergy to eggs: Secondary | ICD-10-CM | POA: Insufficient documentation

## 2017-04-20 DIAGNOSIS — Z91013 Allergy to seafood: Secondary | ICD-10-CM | POA: Insufficient documentation

## 2017-04-20 DIAGNOSIS — M542 Cervicalgia: Secondary | ICD-10-CM | POA: Diagnosis not present

## 2017-04-20 DIAGNOSIS — S13170A Subluxation of C6/C7 cervical vertebrae, initial encounter: Secondary | ICD-10-CM | POA: Diagnosis not present

## 2017-04-20 DIAGNOSIS — Z79899 Other long term (current) drug therapy: Secondary | ICD-10-CM | POA: Diagnosis not present

## 2017-04-20 DIAGNOSIS — Z7901 Long term (current) use of anticoagulants: Secondary | ICD-10-CM | POA: Insufficient documentation

## 2017-04-20 DIAGNOSIS — T1490XA Injury, unspecified, initial encounter: Secondary | ICD-10-CM

## 2017-04-20 DIAGNOSIS — M4802 Spinal stenosis, cervical region: Secondary | ICD-10-CM

## 2017-04-20 DIAGNOSIS — M50223 Other cervical disc displacement at C6-C7 level: Secondary | ICD-10-CM | POA: Diagnosis not present

## 2017-04-20 DIAGNOSIS — I10 Essential (primary) hypertension: Secondary | ICD-10-CM | POA: Diagnosis not present

## 2017-04-20 DIAGNOSIS — M50823 Other cervical disc disorders at C6-C7 level: Secondary | ICD-10-CM | POA: Insufficient documentation

## 2017-04-20 DIAGNOSIS — Z419 Encounter for procedure for purposes other than remedying health state, unspecified: Secondary | ICD-10-CM

## 2017-04-20 DIAGNOSIS — S12500A Unspecified displaced fracture of sixth cervical vertebra, initial encounter for closed fracture: Secondary | ICD-10-CM | POA: Diagnosis not present

## 2017-04-20 DIAGNOSIS — J929 Pleural plaque without asbestos: Secondary | ICD-10-CM | POA: Diagnosis not present

## 2017-04-20 LAB — PROTIME-INR
INR: 1.04
PROTHROMBIN TIME: 13.5 s (ref 11.4–15.2)

## 2017-04-20 LAB — CBC WITH DIFFERENTIAL/PLATELET
Basophils Absolute: 0 10*3/uL (ref 0.0–0.1)
Basophils Relative: 1 %
EOS ABS: 0.2 10*3/uL (ref 0.0–0.7)
EOS PCT: 3 %
HCT: 43.7 % (ref 39.0–52.0)
Hemoglobin: 15.5 g/dL (ref 13.0–17.0)
Lymphocytes Relative: 41 %
Lymphs Abs: 2.5 10*3/uL (ref 0.7–4.0)
MCH: 32.4 pg (ref 26.0–34.0)
MCHC: 35.5 g/dL (ref 30.0–36.0)
MCV: 91.4 fL (ref 78.0–100.0)
MONO ABS: 0.6 10*3/uL (ref 0.1–1.0)
MONOS PCT: 10 %
Neutro Abs: 2.9 10*3/uL (ref 1.7–7.7)
Neutrophils Relative %: 45 %
PLATELETS: 264 10*3/uL (ref 150–400)
RBC: 4.78 MIL/uL (ref 4.22–5.81)
RDW: 12.1 % (ref 11.5–15.5)
WBC: 6.2 10*3/uL (ref 4.0–10.5)

## 2017-04-20 LAB — BASIC METABOLIC PANEL
Anion gap: 9 (ref 5–15)
BUN: 13 mg/dL (ref 6–20)
CO2: 25 mmol/L (ref 22–32)
CREATININE: 1.03 mg/dL (ref 0.61–1.24)
Calcium: 9.1 mg/dL (ref 8.9–10.3)
Chloride: 103 mmol/L (ref 101–111)
GFR calc Af Amer: >60 mL/min (ref >60)
GFR calc non Af Amer: >60 mL/min (ref >60)
Glucose, Bld: 160 mg/dL — ABNORMAL HIGH (ref 65–99)
Potassium: 4 mmol/L (ref 3.5–5.1)
SODIUM: 137 mmol/L (ref 135–145)

## 2017-04-20 LAB — GLUCOSE, CAPILLARY: GLUCOSE-CAPILLARY: 175 mg/dL — AB (ref 65–99)

## 2017-04-20 MED ORDER — LISINOPRIL 20 MG PO TABS
20.0000 mg | ORAL_TABLET | Freq: Every day | ORAL | Status: DC
  Administered 2017-04-21 – 2017-04-23 (×2): 20 mg via ORAL
  Filled 2017-04-20 (×3): qty 1

## 2017-04-20 MED ORDER — DOCUSATE SODIUM 100 MG PO CAPS
100.0000 mg | ORAL_CAPSULE | Freq: Two times a day (BID) | ORAL | Status: DC
  Administered 2017-04-20 – 2017-04-22 (×4): 100 mg via ORAL
  Filled 2017-04-20 (×6): qty 1

## 2017-04-20 MED ORDER — HYDROCODONE-ACETAMINOPHEN 5-325 MG PO TABS: 1.0000 | ORAL_TABLET | ORAL | Status: DC | PRN

## 2017-04-20 MED ORDER — LISINOPRIL-HYDROCHLOROTHIAZIDE 20-12.5 MG PO TABS: 1.0000 | ORAL_TABLET | Freq: Every day | ORAL | Status: DC

## 2017-04-20 MED ORDER — CHLORHEXIDINE GLUCONATE CLOTH 2 % EX PADS: 6.0000 | MEDICATED_PAD | Freq: Once | CUTANEOUS | Status: DC

## 2017-04-20 MED ORDER — INSULIN ASPART 100 UNIT/ML ~~LOC~~ SOLN
0.0000 [IU] | Freq: Three times a day (TID) | SUBCUTANEOUS | Status: DC
  Administered 2017-04-21 – 2017-04-22 (×4): 3 [IU] via SUBCUTANEOUS

## 2017-04-20 MED ORDER — FENOFIBRATE 160 MG PO TABS
160.0000 mg | ORAL_TABLET | Freq: Every day | ORAL | Status: DC
  Administered 2017-04-20 – 2017-04-23 (×3): 160 mg via ORAL
  Filled 2017-04-20 (×4): qty 1

## 2017-04-20 MED ORDER — IOPAMIDOL (ISOVUE-370) INJECTION 76%
INTRAVENOUS | Status: AC
  Administered 2017-04-20: 50 mL
  Filled 2017-04-20: qty 50

## 2017-04-20 MED ORDER — ONDANSETRON HCL 4 MG/2ML IJ SOLN: 4.0000 mg | Freq: Four times a day (QID) | INTRAMUSCULAR | Status: DC | PRN

## 2017-04-20 MED ORDER — ONDANSETRON 4 MG PO TBDP: 4.0000 mg | ORAL_TABLET | Freq: Four times a day (QID) | ORAL | Status: DC | PRN

## 2017-04-20 MED ORDER — POTASSIUM CHLORIDE IN NACL 20-0.9 MEQ/L-% IV SOLN
INTRAVENOUS | Status: DC
  Administered 2017-04-20: 23:00:00 via INTRAVENOUS
  Filled 2017-04-20 (×2): qty 1000

## 2017-04-20 MED ORDER — HYDROCHLOROTHIAZIDE 12.5 MG PO CAPS
12.5000 mg | ORAL_CAPSULE | Freq: Every day | ORAL | Status: DC
  Administered 2017-04-21 – 2017-04-23 (×2): 12.5 mg via ORAL
  Filled 2017-04-20 (×3): qty 1

## 2017-04-20 NOTE — H&P (Signed)
Subjective:   Patient is a 62 y.o. male seen regarding neck pain after a fall at work on Monday. The patient first presented with complaints of neck pain and tightness. Onset of symptoms was 4 days ago, with his symptoms improving since that time. Onset as  related to a fall off of his truck at work landing on the back of his neck. The pain is rated 0/10, and is located at the back of his neck. The symptoms have not been progressive. Symptoms are exacerbated by nothing and are relieved by rest. History negative for arm NT or W.  Past Medical History:  Diagnosis Date  . Diabetes mellitus    2  . Diverticulosis   . GERD (gastroesophageal reflux disease)   . Gilbert syndrome   . Hyperlipidemia   . Hypertension   . Internal hemorrhoid   . Pancreatitis   . Pneumonia   . 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 CHOLECYSTECTOMY      Allergies  Allergen Reactions  . Penicillins Hives and Rash    Has patient had a PCN reaction causing immediate rash, facial/tongue/throat swelling, SOB or lightheadedness with hypotension: Yes Has patient had a PCN reaction causing severe rash involving mucus membranes or skin necrosis: No Has patient had a PCN reaction that required hospitalization: No Has patient had a PCN reaction occurring within the last 10 years: Yes If all of the above answers are "NO", then may proceed with Cephalosporin use.   . Fish Oil Nausea And Vomiting and Other (See Comments)    GI upset also   . Eggs Or Egg-Derived Products Nausea And Vomiting    Social History   Tobacco Use  . Smoking status: Never Smoker  . Smokeless tobacco: Never Used  Substance Use Topics  . Alcohol use: No    Family History  Problem Relation Age of Onset  . Heart disease Father   . Diabetes Paternal Aunt   . Breast cancer Sister        x 2  . Colon cancer Neg Hx    Prior to Admission medications   Medication Sig Start Date End Date  Taking? Authorizing Provider  aspirin 81 MG tablet Take 81 mg by mouth daily.    [provider]  fenofibrate 160 MG tablet TAKE 1 TABLET BY MOUTH  DAILY 02/21/17   Midge Minium, MD  FREESTYLE LITE test strip  07/03/15   [provider]  Lancets (FREESTYLE) lancets 1 each by Other route 3 (three) times daily. PRN. E11.9 09/15/14   Midge Minium, MD  lisinopril-hydrochlorothiazide (PRINZIDE,ZESTORETIC) 20-12.5 MG tablet TAKE 1 TABLET BY MOUTH  DAILY 11/10/16   Midge Minium, MD  metFORMIN (GLUCOPHAGE) 1000 MG tablet TAKE 1 TABLET BY MOUTH  TWICE A DAY WITH MEALS 02/21/17   Midge Minium, MD  omeprazole (PRILOSEC) 40 MG capsule TAKE 1 CAPSULE BY MOUTH  DAILY 11/10/16   Midge Minium, MD  simvastatin (ZOCOR) 40 MG tablet TAKE 1 TABLET BY MOUTH AT  BEDTIME 11/10/16   Midge Minium, MD     Review of Systems  Positive ROS: negative  All other systems have been reviewed and were otherwise negative with the exception of those mentioned in the HPI and as above.  Objective: Vital signs in last 24 hours: Temp:  [98.2 F (36.8 C)] 98.2 F (36.8 C) (03/28 1936) Pulse Rate:  [60] 60 (03/28 1936) Resp:  [  16] 16 (03/28 1936) BP: (147)/(77) 147/77 (03/28 1936) SpO2:  [96 %] 96 % (03/28 1936)  General Appearance: Alert, cooperative, no distress, appears stated age Head: Normocephalic, without obvious abnormality, atraumatic Eyes: PERRL, conjunctiva/corneas clear, EOM's intact, fundi benign, both eyes  Lungs: respirations unlabored Extremities: Extremities normal, atraumatic, no cyanosis or edema Pulses: 2+ and symmetric all extremities Skin: Skin color, texture, turgor normal, no rashes or lesions  NEUROLOGIC:   Mental status: A&O x4, no aphasia, good AS, fund of knowledge and memory Motor Exam - grossly normal Sensory Exam - grossly normal Reflexes: Coordination - grossly normal Gait - grossly normal Balance - grossly normal Cranial  Nerves: I: smell Not tested  II: visual acuity  OS: na  OD: na  II: visual fields Full to confrontation  II: pupils Equal, round, reactive to light  III,VII: ptosis None  III,IV,VI: extraocular muscles  Full ROM  V: mastication   V: facial light touch sensation    V,VII: corneal reflex    VII: facial muscle function - upper    VII: facial muscle function - lower   VIII: hearing   IX: soft palate elevation    IX,X: gag reflex   XI: trapezius strength    XI: sternocleidomastoid strength 5/5  XI: neck flexion strength  5/5  XII: tongue strength      Data Review Lab Results  Component Value Date   WBC 7.1 09/29/2016   HGB 15.6 09/29/2016   HCT 45.7 09/29/2016   MCV 94.5 09/29/2016   PLT 289.0 09/29/2016   Lab Results  Component Value Date   NA 139 09/29/2016   K 4.2 09/29/2016   CL 102 09/29/2016   CO2 30 09/29/2016   BUN 27 (H) 09/29/2016   CREATININE 1.34 09/29/2016   GLUCOSE 217 (H) 09/29/2016   No results found for: INR, PROTIME  Assessment/Plan: Patient presented today after he sustained a fall at work off of his truck on Monday. After seeing Dr Mardelle Matte on Wednesday, an MRI was ordered of his neck. His pain as significantly improved since Monday. He is in no pain and does not have any weakness on exam. MRI reveals a subluxation at C6-7 with a tear in the ligamentum flavum, a perched facet on the left at C6-7 and a jumped facet on the right. MRI also reveal decreased blood flow in the left vertebral body suggesting dissection. We will admit him to the floor in a hard collar on bedrest. Will order a CTA of neck. Will plan to do an ACDF of C5-7 with possible posterior instrumentation on Saturday. Discussed the risk and benefits associated with the surgery. He understood and agrees to proceed.    Ocie Cornfield [facility] 04/20/2017 8:12 PM

## 2017-04-20 NOTE — Progress Notes (Signed)
Pt admitted directly from the doctor's office, said to be having neck surgery on Saturday, pt settled in bed with family and call light at bedside, admit doc paged for orders, was reassured and will continue to monitor, v/s stable. [name], Pedro [name]

## 2017-04-21 ENCOUNTER — Encounter (HOSPITAL_COMMUNITY): Payer: Self-pay | Admitting: *Deleted

## 2017-04-21 ENCOUNTER — Other Ambulatory Visit: Payer: Self-pay

## 2017-04-21 DIAGNOSIS — S13100A Subluxation of unspecified cervical vertebrae, initial encounter: Secondary | ICD-10-CM | POA: Diagnosis not present

## 2017-04-21 DIAGNOSIS — S12600A Unspecified displaced fracture of seventh cervical vertebra, initial encounter for closed fracture: Secondary | ICD-10-CM | POA: Diagnosis not present

## 2017-04-21 DIAGNOSIS — S199XXA Unspecified injury of neck, initial encounter: Secondary | ICD-10-CM | POA: Diagnosis not present

## 2017-04-21 LAB — GLUCOSE, CAPILLARY
GLUCOSE-CAPILLARY: 190 mg/dL — AB (ref 65–99)
Glucose-Capillary: 198 mg/dL — ABNORMAL HIGH (ref 65–99)
Glucose-Capillary: 153 mg/dL — ABNORMAL HIGH (ref 65–99)
Glucose-Capillary: 137 mg/dL — ABNORMAL HIGH (ref 65–99)

## 2017-04-21 LAB — SURGICAL PCR SCREEN
MRSA, PCR: NEGATIVE
STAPHYLOCOCCUS AUREUS: POSITIVE — AB

## 2017-04-21 LAB — HIV ANTIBODY (ROUTINE TESTING W REFLEX): HIV Screen 4th Generation wRfx: NONREACTIVE

## 2017-04-21 MED ORDER — CHLORHEXIDINE GLUCONATE CLOTH 2 % EX PADS
6.0000 | MEDICATED_PAD | Freq: Every day | CUTANEOUS | Status: DC
  Administered 2017-04-21 – 2017-04-22 (×2): 6 via TOPICAL

## 2017-04-21 MED ORDER — MUPIROCIN 2 % EX OINT
1.0000 "application " | TOPICAL_OINTMENT | Freq: Two times a day (BID) | CUTANEOUS | Status: DC
  Administered 2017-04-21 (×3): 1 via NASAL
  Filled 2017-04-21: qty 22

## 2017-04-21 NOTE — Progress Notes (Signed)
Patient ID: Pedro [name], male   DOB: 1955-03-04, 62 y.o.   MRN: [id number] Patient seen and examined. He is doing extremely well. He has no pain. He has a normal neurologic exam. He is in a cervical collar. I have explained the situation to him. I've explained the reasoning for the need for surgery despite the fact that he is doing well clinically at this point. I have explained the surgery to him in detail as best I can. He understands the risk of the surgery include but are not limited to bleeding, infection, injury, spinal cord injury, numbness, weakness, paralysis, recurrent laryngeal nerve injury, hoarseness, difficulty swallowing, pseudoarthrosis, hardware failure, need for further surgery, lack of relief of symptoms, worsening symptoms, she'll injury, tracheal injury, vertebral carotid artery injury, stroke, and anesthesia risk. He understands there may be a need for a 360 operation. We plan on surgery tomorrow.

## 2017-04-21 NOTE — Progress Notes (Signed)
Doing well. No pain in neck, no NTW in arms. Continue aspen collar. May be up ad lib with assistance. Scheduled for surgery in the morning. NPO after midnight

## 2017-04-21 NOTE — Care Management Note (Signed)
Case Management Note  Patient Details  Name: JAKOTA MANTHEI MRN: [id number] Date of Birth: 03-08-1955  Subjective/Objective:      Pt admitted after a fall at work and a C7 fracture. He is from home with his spouse.               Action/Plan: Plan is for surgery tomorrow. Await therapies notes post surgery.  Pt has no insurance and states that workers comp is covering this event. Information provided to financial counseling. Pts adjuster is Yvone Neu: [phone] through [place].  CM following.  Expected Discharge Date:                  Expected Discharge Plan:     In-House Referral:     Discharge planning Services     Post Acute Care Choice:    Choice offered to:     DME Arranged:    DME Agency:     HH Arranged:    HH Agency:     Status of Service:  In process, will continue to follow  If discussed at Long Length of Stay Meetings, dates discussed:    Additional Comments:  Pollie Friar, RN 04/21/2017, 2:44 PM

## 2017-04-22 ENCOUNTER — Inpatient Hospital Stay (HOSPITAL_COMMUNITY): Admitting: Certified Registered"

## 2017-04-22 ENCOUNTER — Ambulatory Visit (HOSPITAL_COMMUNITY)
Admission: AD | Disposition: A | Discharge status: Home / Self Care | Payer: Self-pay | Source: Ambulatory Visit | Attending: Neurological Surgery

## 2017-04-22 ENCOUNTER — Inpatient Hospital Stay (HOSPITAL_COMMUNITY): Admission: RE | Admit: 2017-04-22 | Payer: 59 | Source: Ambulatory Visit | Admitting: Neurological Surgery

## 2017-04-22 ENCOUNTER — Inpatient Hospital Stay (HOSPITAL_COMMUNITY)

## 2017-04-22 ENCOUNTER — Encounter (HOSPITAL_COMMUNITY): Payer: Self-pay | Admitting: Anesthesiology

## 2017-04-22 DIAGNOSIS — M50223 Other cervical disc displacement at C6-C7 level: Secondary | ICD-10-CM | POA: Diagnosis not present

## 2017-04-22 DIAGNOSIS — S12500A Unspecified displaced fracture of sixth cervical vertebra, initial encounter for closed fracture: Secondary | ICD-10-CM | POA: Diagnosis not present

## 2017-04-22 DIAGNOSIS — S14107A Unspecified injury at C7 level of cervical spinal cord, initial encounter: Secondary | ICD-10-CM | POA: Diagnosis not present

## 2017-04-22 DIAGNOSIS — M40202 Unspecified kyphosis, cervical region: Secondary | ICD-10-CM | POA: Diagnosis not present

## 2017-04-22 DIAGNOSIS — S134XXA Sprain of ligaments of cervical spine, initial encounter: Secondary | ICD-10-CM | POA: Diagnosis not present

## 2017-04-22 DIAGNOSIS — S14106A Unspecified injury at C6 level of cervical spinal cord, initial encounter: Secondary | ICD-10-CM | POA: Diagnosis not present

## 2017-04-22 DIAGNOSIS — Z981 Arthrodesis status: Secondary | ICD-10-CM

## 2017-04-22 DIAGNOSIS — M4322 Fusion of spine, cervical region: Secondary | ICD-10-CM | POA: Diagnosis not present

## 2017-04-22 DIAGNOSIS — S12600A Unspecified displaced fracture of seventh cervical vertebra, initial encounter for closed fracture: Secondary | ICD-10-CM | POA: Diagnosis not present

## 2017-04-22 HISTORY — PX: ANTERIOR CERVICAL DECOMP/DISCECTOMY FUSION: SHX1161

## 2017-04-22 LAB — GLUCOSE, CAPILLARY
Glucose-Capillary: 179 mg/dL — ABNORMAL HIGH (ref 65–99)
Glucose-Capillary: 155 mg/dL — ABNORMAL HIGH (ref 65–99)
Glucose-Capillary: 225 mg/dL — ABNORMAL HIGH (ref 65–99)
Glucose-Capillary: 255 mg/dL — ABNORMAL HIGH (ref 65–99)
Glucose-Capillary: 192 mg/dL — ABNORMAL HIGH (ref 65–99)

## 2017-04-22 SURGERY — ANTERIOR CERVICAL DECOMPRESSION/DISCECTOMY FUSION 1 LEVEL
Anesthesia: General | Site: Neck

## 2017-04-22 MED ORDER — LACTATED RINGERS IV SOLN: INTRAVENOUS | Status: DC

## 2017-04-22 MED ORDER — MEPERIDINE HCL 50 MG/ML IJ SOLN: 6.2500 mg | INTRAMUSCULAR | Status: DC | PRN

## 2017-04-22 MED ORDER — ACETAMINOPHEN 650 MG RE SUPP: 650.0000 mg | RECTAL | Status: DC | PRN

## 2017-04-22 MED ORDER — THROMBIN (RECOMBINANT) 5000 UNITS EX SOLR
OROMUCOSAL | Status: DC | PRN
  Administered 2017-04-22 (×2): 5 mL via TOPICAL

## 2017-04-22 MED ORDER — ACETAMINOPHEN 325 MG PO TABS: 650.0000 mg | ORAL_TABLET | ORAL | Status: DC | PRN

## 2017-04-22 MED ORDER — SODIUM CHLORIDE 0.9% FLUSH: 3.0000 mL | Freq: Two times a day (BID) | INTRAVENOUS | Status: DC

## 2017-04-22 MED ORDER — SUGAMMADEX SODIUM 200 MG/2ML IV SOLN
INTRAVENOUS | Status: AC
  Filled 2017-04-22: qty 2

## 2017-04-22 MED ORDER — PHENYLEPHRINE 40 MCG/ML (10ML) SYRINGE FOR IV PUSH (FOR BLOOD PRESSURE SUPPORT)
PREFILLED_SYRINGE | INTRAVENOUS | Status: AC
  Filled 2017-04-22: qty 10

## 2017-04-22 MED ORDER — ONDANSETRON HCL 4 MG/2ML IJ SOLN
INTRAMUSCULAR | Status: AC
  Filled 2017-04-22: qty 2

## 2017-04-22 MED ORDER — INSULIN ASPART 100 UNIT/ML ~~LOC~~ SOLN
0.0000 [IU] | Freq: Three times a day (TID) | SUBCUTANEOUS | Status: DC
  Administered 2017-04-22: 8 [IU] via SUBCUTANEOUS
  Administered 2017-04-23: 3 [IU] via SUBCUTANEOUS

## 2017-04-22 MED ORDER — POTASSIUM CHLORIDE IN NACL 20-0.9 MEQ/L-% IV SOLN
INTRAVENOUS | Status: DC
  Administered 2017-04-22: 16:00:00 via INTRAVENOUS
  Filled 2017-04-22 (×2): qty 1000

## 2017-04-22 MED ORDER — LIDOCAINE HCL (CARDIAC) 20 MG/ML IV SOLN
INTRAVENOUS | Status: DC | PRN
  Administered 2017-04-22: 60 mg via INTRAVENOUS

## 2017-04-22 MED ORDER — ROCURONIUM BROMIDE 100 MG/10ML IV SOLN
INTRAVENOUS | Status: DC | PRN
  Administered 2017-04-22: 50 mg via INTRAVENOUS

## 2017-04-22 MED ORDER — EPHEDRINE SULFATE 50 MG/ML IJ SOLN
INTRAMUSCULAR | Status: DC | PRN
  Administered 2017-04-22: 15 mg via INTRAVENOUS
  Administered 2017-04-22: 10 mg via INTRAVENOUS

## 2017-04-22 MED ORDER — PROMETHAZINE HCL 25 MG/ML IJ SOLN: 6.2500 mg | INTRAMUSCULAR | Status: DC | PRN

## 2017-04-22 MED ORDER — ONDANSETRON HCL 4 MG/2ML IJ SOLN: 4.0000 mg | Freq: Four times a day (QID) | INTRAMUSCULAR | Status: DC | PRN

## 2017-04-22 MED ORDER — VANCOMYCIN HCL IN DEXTROSE 1-5 GM/200ML-% IV SOLN
1000.0000 mg | Freq: Once | INTRAVENOUS | Status: AC
  Administered 2017-04-22: 1000 mg via INTRAVENOUS
  Filled 2017-04-22: qty 200

## 2017-04-22 MED ORDER — DEXAMETHASONE SODIUM PHOSPHATE 10 MG/ML IJ SOLN
10.0000 mg | INTRAMUSCULAR | Status: AC
  Administered 2017-04-22: 10 mg via INTRAVENOUS
  Filled 2017-04-22: qty 1

## 2017-04-22 MED ORDER — HYDROMORPHONE HCL 1 MG/ML IJ SOLN: 0.5000 mg | INTRAMUSCULAR | Status: DC | PRN

## 2017-04-22 MED ORDER — EPHEDRINE 5 MG/ML INJ
INTRAVENOUS | Status: AC
  Filled 2017-04-22: qty 10

## 2017-04-22 MED ORDER — LIDOCAINE HCL (CARDIAC) 20 MG/ML IV SOLN
INTRAVENOUS | Status: AC
  Filled 2017-04-22: qty 5

## 2017-04-22 MED ORDER — HYDROMORPHONE HCL 1 MG/ML IJ SOLN: 0.2500 mg | INTRAMUSCULAR | Status: DC | PRN

## 2017-04-22 MED ORDER — PHENOL 1.4 % MT LIQD: 1.0000 | OROMUCOSAL | Status: DC | PRN

## 2017-04-22 MED ORDER — MIDAZOLAM HCL 2 MG/2ML IJ SOLN
INTRAMUSCULAR | Status: AC
  Filled 2017-04-22: qty 2

## 2017-04-22 MED ORDER — FENTANYL CITRATE (PF) 100 MCG/2ML IJ SOLN
INTRAMUSCULAR | Status: DC | PRN
  Administered 2017-04-22: 100 ug via INTRAVENOUS
  Administered 2017-04-22 (×3): 50 ug via INTRAVENOUS

## 2017-04-22 MED ORDER — PROPOFOL 10 MG/ML IV BOLUS
INTRAVENOUS | Status: AC
  Filled 2017-04-22: qty 20

## 2017-04-22 MED ORDER — ROCURONIUM BROMIDE 10 MG/ML (PF) SYRINGE
PREFILLED_SYRINGE | INTRAVENOUS | Status: AC
  Filled 2017-04-22: qty 5

## 2017-04-22 MED ORDER — HYDROCODONE-ACETAMINOPHEN 7.5-325 MG PO TABS: 1.0000 | ORAL_TABLET | ORAL | Status: DC | PRN

## 2017-04-22 MED ORDER — FENTANYL CITRATE (PF) 250 MCG/5ML IJ SOLN
INTRAMUSCULAR | Status: AC
  Filled 2017-04-22: qty 5

## 2017-04-22 MED ORDER — SUCCINYLCHOLINE CHLORIDE 20 MG/ML IJ SOLN
INTRAMUSCULAR | Status: DC | PRN
  Administered 2017-04-22: 140 mg via INTRAVENOUS

## 2017-04-22 MED ORDER — PROPOFOL 10 MG/ML IV BOLUS
INTRAVENOUS | Status: DC | PRN
  Administered 2017-04-22: 150 mg via INTRAVENOUS

## 2017-04-22 MED ORDER — HEMOSTATIC AGENTS (NO CHARGE) OPTIME
TOPICAL | Status: DC | PRN
  Administered 2017-04-22: 1 via TOPICAL

## 2017-04-22 MED ORDER — LACTATED RINGERS IV SOLN
INTRAVENOUS | Status: DC | PRN
  Administered 2017-04-22 (×2): via INTRAVENOUS

## 2017-04-22 MED ORDER — DEXAMETHASONE SODIUM PHOSPHATE 10 MG/ML IJ SOLN
INTRAMUSCULAR | Status: AC
  Filled 2017-04-22: qty 1

## 2017-04-22 MED ORDER — METHOCARBAMOL 500 MG PO TABS: 500.0000 mg | ORAL_TABLET | Freq: Four times a day (QID) | ORAL | Status: DC | PRN

## 2017-04-22 MED ORDER — MIDAZOLAM HCL 5 MG/5ML IJ SOLN
INTRAMUSCULAR | Status: DC | PRN
  Administered 2017-04-22: 2 mg via INTRAVENOUS

## 2017-04-22 MED ORDER — METHOCARBAMOL 1000 MG/10ML IJ SOLN
500.0000 mg | Freq: Four times a day (QID) | INTRAVENOUS | Status: DC | PRN
  Filled 2017-04-22: qty 5

## 2017-04-22 MED ORDER — SUCCINYLCHOLINE CHLORIDE 200 MG/10ML IV SOSY
PREFILLED_SYRINGE | INTRAVENOUS | Status: AC
  Filled 2017-04-22: qty 10

## 2017-04-22 MED ORDER — VANCOMYCIN HCL IN DEXTROSE 1-5 GM/200ML-% IV SOLN
1000.0000 mg | INTRAVENOUS | Status: AC
  Administered 2017-04-22: 1000 mg via INTRAVENOUS
  Filled 2017-04-22: qty 200

## 2017-04-22 MED ORDER — BUPIVACAINE HCL (PF) 0.25 % IJ SOLN
INTRAMUSCULAR | Status: DC | PRN
  Administered 2017-04-22: 3 mL

## 2017-04-22 MED ORDER — SODIUM CHLORIDE 0.9 % IV SOLN: 250.0000 mL | INTRAVENOUS | Status: DC

## 2017-04-22 MED ORDER — 0.9 % SODIUM CHLORIDE (POUR BTL) OPTIME
TOPICAL | Status: DC | PRN
  Administered 2017-04-22: 1000 mL

## 2017-04-22 MED ORDER — SODIUM CHLORIDE 0.9% FLUSH: 3.0000 mL | INTRAVENOUS | Status: DC | PRN

## 2017-04-22 MED ORDER — SENNA 8.6 MG PO TABS
1.0000 | ORAL_TABLET | Freq: Two times a day (BID) | ORAL | Status: DC
  Administered 2017-04-22: 8.6 mg via ORAL
  Filled 2017-04-22 (×3): qty 1

## 2017-04-22 MED ORDER — MENTHOL 3 MG MT LOZG: 1.0000 | LOZENGE | OROMUCOSAL | Status: DC | PRN

## 2017-04-22 MED ORDER — SODIUM CHLORIDE 0.9 % IR SOLN
Status: DC | PRN
  Administered 2017-04-22: 500 mL

## 2017-04-22 MED ORDER — ONDANSETRON HCL 4 MG PO TABS: 4.0000 mg | ORAL_TABLET | Freq: Four times a day (QID) | ORAL | Status: DC | PRN

## 2017-04-22 MED ORDER — SUGAMMADEX SODIUM 200 MG/2ML IV SOLN
INTRAVENOUS | Status: DC | PRN
  Administered 2017-04-22: 200 mg via INTRAVENOUS

## 2017-04-22 SURGICAL SUPPLY — 70 items
APL SKNCLS STERI-STRIP NONHPOA (GAUZE/BANDAGES/DRESSINGS) ×2
BAG DECANTER FOR FLEXI CONT (MISCELLANEOUS) ×8 IMPLANT
BASKET BONE COLLECTION (BASKET) ×3 IMPLANT
BENZOIN TINCTURE PRP APPL 2/3 (GAUZE/BANDAGES/DRESSINGS) ×5 IMPLANT
BIT DRILL 14MM (INSTRUMENTS) ×1 IMPLANT
BLADE CLIPPER SURG (BLADE) IMPLANT
BUR MATCHSTICK NEURO 3.0 LAGG (BURR) ×4 IMPLANT
CANISTER SUCT 3000ML PPV (MISCELLANEOUS) ×8 IMPLANT
CARTRIDGE OIL MAESTRO DRILL (MISCELLANEOUS) ×4 IMPLANT
CLOSURE STERI-STRIP 1/2X4 (GAUZE/BANDAGES/DRESSINGS) ×1
CLOSURE WOUND 1/2 X4 (GAUZE/BANDAGES/DRESSINGS) ×2
CLSR STERI-STRIP ANTIMIC 1/2X4 (GAUZE/BANDAGES/DRESSINGS) ×2 IMPLANT
DIFFUSER DRILL AIR PNEUMATIC (MISCELLANEOUS) ×8 IMPLANT
DRAPE C-ARM 42X72 X-RAY (DRAPES) ×19 IMPLANT
DRAPE LAPAROTOMY 100X72 PEDS (DRAPES) ×8 IMPLANT
DRAPE MICROSCOPE LEICA (MISCELLANEOUS) ×4 IMPLANT
DRAPE POUCH INSTRU U-SHP 10X18 (DRAPES) ×4 IMPLANT
DRILL 14MM (INSTRUMENTS) ×4
DRSG OPSITE POSTOP 3X4 (GAUZE/BANDAGES/DRESSINGS) ×3 IMPLANT
DURAPREP 26ML APPLICATOR (WOUND CARE) ×4 IMPLANT
DURAPREP 6ML APPLICATOR 50/CS (WOUND CARE) ×4 IMPLANT
ELECT BLADE 4.0 EZ CLEAN MEGAD (MISCELLANEOUS) ×4
ELECT COATED BLADE 2.86 ST (ELECTRODE) ×4 IMPLANT
ELECT REM PT RETURN 9FT ADLT (ELECTROSURGICAL) ×8
ELECTRODE BLDE 4.0 EZ CLN MEGD (MISCELLANEOUS) ×1 IMPLANT
ELECTRODE REM PT RTRN 9FT ADLT (ELECTROSURGICAL) ×4 IMPLANT
EVACUATOR 1/8 PVC DRAIN (DRAIN) IMPLANT
GAUZE SPONGE 4X4 16PLY XRAY LF (GAUZE/BANDAGES/DRESSINGS) IMPLANT
GLOVE BIO SURGEON STRL SZ7 (GLOVE) ×9 IMPLANT
GLOVE BIO SURGEON STRL SZ8 (GLOVE) ×11 IMPLANT
GLOVE BIO SURGEON STRL SZ8.5 (GLOVE) ×3 IMPLANT
GLOVE BIOGEL PI IND STRL 7.0 (GLOVE) IMPLANT
GLOVE BIOGEL PI IND STRL 7.5 (GLOVE) ×1 IMPLANT
GLOVE BIOGEL PI INDICATOR 7.0 (GLOVE)
GLOVE BIOGEL PI INDICATOR 7.5 (GLOVE) ×2
GOWN STRL REUS W/ TWL LRG LVL3 (GOWN DISPOSABLE) ×1 IMPLANT
GOWN STRL REUS W/ TWL XL LVL3 (GOWN DISPOSABLE) ×3 IMPLANT
GOWN STRL REUS W/TWL 2XL LVL3 (GOWN DISPOSABLE) ×1 IMPLANT
GOWN STRL REUS W/TWL LRG LVL3 (GOWN DISPOSABLE) ×4
GOWN STRL REUS W/TWL XL LVL3 (GOWN DISPOSABLE) ×8
HEMOSTAT POWDER KIT SURGIFOAM (HEMOSTASIS) ×4 IMPLANT
KIT BASIN OR (CUSTOM PROCEDURE TRAY) ×8 IMPLANT
KIT TURNOVER KIT B (KITS) ×8 IMPLANT
MARKER SKIN DUAL TIP RULER LAB (MISCELLANEOUS) ×4 IMPLANT
NDL HYPO 25X1 1.5 SAFETY (NEEDLE) ×2 IMPLANT
NDL SPNL 20GX3.5 QUINCKE YW (NEEDLE) ×1 IMPLANT
NEEDLE HYPO 25X1 1.5 SAFETY (NEEDLE) ×8 IMPLANT
NEEDLE SPNL 20GX3.5 QUINCKE YW (NEEDLE) ×4 IMPLANT
NS IRRIG 1000ML POUR BTL (IV SOLUTION) ×8 IMPLANT
OIL CARTRIDGE MAESTRO DRILL (MISCELLANEOUS) ×8
PACK LAMINECTOMY NEURO (CUSTOM PROCEDURE TRAY) ×8 IMPLANT
PAD ARMBOARD 7.5X6 YLW CONV (MISCELLANEOUS) ×8 IMPLANT
PIN DISTRACTION 14MM (PIN) ×8 IMPLANT
PIN MAYFIELD SKULL DISP (PIN) ×4 IMPLANT
PLATE 1 LEVEL 14MM (Plate) ×3 IMPLANT
RUBBERBAND STERILE (MISCELLANEOUS) ×8 IMPLANT
SCREW 16MM 40X16 FIXED (Screw) ×12 IMPLANT
SLEEVE SURGEON STRL (DRAPES) ×3 IMPLANT
SPACER PTI-C 8X18X16 7D (Spacer) ×3 IMPLANT
SPONGE INTESTINAL PEANUT (DISPOSABLE) ×4 IMPLANT
SPONGE LAP 4X18 X RAY DECT (DISPOSABLE) IMPLANT
SPONGE SURGIFOAM ABS GEL SZ50 (HEMOSTASIS) ×8 IMPLANT
STRIP CLOSURE SKIN 1/2X4 (GAUZE/BANDAGES/DRESSINGS) ×6 IMPLANT
SUT VIC AB 0 CT1 18XCR BRD8 (SUTURE) ×1 IMPLANT
SUT VIC AB 0 CT1 8-18 (SUTURE)
SUT VIC AB 2-0 CP2 18 (SUTURE) ×1 IMPLANT
SUT VIC AB 3-0 SH 8-18 (SUTURE) ×11 IMPLANT
TOWEL GREEN STERILE (TOWEL DISPOSABLE) ×8 IMPLANT
TOWEL GREEN STERILE FF (TOWEL DISPOSABLE) ×8 IMPLANT
WATER STERILE IRR 1000ML POUR (IV SOLUTION) ×8 IMPLANT

## 2017-04-22 NOTE — Transfer of Care (Signed)
Immediate Anesthesia Transfer of Care Note  Patient: Pedro [name]  Procedure(s) Performed: ANTERIOR CERVICAL DECOMPRESSION/DISCECTOMY FUSION 1 LEVEL  C6-7 (N/A Neck)  Patient Location: PACU  Anesthesia Type:General  Level of Consciousness: awake and patient cooperative  Airway & Oxygen Therapy: Patient Spontanous Breathing and Patient connected to face mask oxygen  Post-op Assessment: Report given to RN and Post -op Vital signs reviewed and stable  Post vital signs: Reviewed and stable  Last Vitals:  Vitals Value Taken Time  BP 138/118 04/22/2017 12:38 PM  Temp 36.1 C 04/22/2017 12:38 PM  Pulse 93 04/22/2017 12:38 PM  Resp 17 04/22/2017 12:38 PM  SpO2 96 % 04/22/2017 12:38 PM    Last Pain:  Vitals:   04/22/17 0734  TempSrc: Oral  PainSc:          Complications: No apparent anesthesia complications

## 2017-04-22 NOTE — Anesthesia Procedure Notes (Signed)
Procedure Name: Intubation Date/Time: 04/22/2017 10:27 AM Performed by: Lance Coon, CRNA Pre-anesthesia Checklist: Emergency Drugs available, Suction available, Patient being monitored, Timeout performed and Patient identified Patient Re-evaluated:Patient Re-evaluated prior to induction Oxygen Delivery Method: Circle system utilized Preoxygenation: Pre-oxygenation with 100% oxygen Induction Type: IV induction Laryngoscope Size: Glidescope and 4 Grade View: Grade I Tube type: Oral Tube size: 7.5 mm Number of attempts: 1 Airway Equipment and Method: Stylet and Video-laryngoscopy Placement Confirmation: ETT inserted through vocal cords under direct vision,  positive ETCO2 and breath sounds checked- equal and bilateral Secured at: 21 cm Tube secured with: Tape Dental Injury: Teeth and Oropharynx as per pre-operative assessment

## 2017-04-22 NOTE — Anesthesia Postprocedure Evaluation (Signed)
Anesthesia Post Note  Patient: Pedro [name]  Procedure(s) Performed: ANTERIOR CERVICAL DECOMPRESSION/DISCECTOMY FUSION 1 LEVEL  C6-7 (N/A Neck)     Patient location during evaluation: PACU Anesthesia Type: General Level of consciousness: awake and alert Pain management: pain level controlled Vital Signs Assessment: post-procedure vital signs reviewed and stable Respiratory status: spontaneous breathing, nonlabored ventilation, respiratory function stable and patient connected to nasal cannula oxygen Cardiovascular status: blood pressure returned to baseline and stable Postop Assessment: no apparent nausea or vomiting Anesthetic complications: no    Last Vitals:  Vitals:   04/22/17 1322 04/22/17 1352  BP: 120/66 127/73  Pulse: 67 69  Resp: (!) 22 16  Temp: 36.5 C 36.5 C  SpO2: 95% 92%    Last Pain:  Vitals:   04/22/17 1352  TempSrc: Oral  PainSc:                  Effie Berkshire

## 2017-04-22 NOTE — Progress Notes (Signed)
Pharmacy Antibiotic Note  Pedro [name] is a 62 y.o. male admitted on 04/20/2017 for planned neck surgery on 04/22/17.  Neck pain after a fall at work on Monday.  Pharmacy has been consulted for  Vancomycin dosing for surgical prophylaxis s/p decompressive anterior cervical discectomy today.    Vancomycin 1000 mg IV preop given this AM at 10:30.  No drain placed.  SCR 1.03, CrCl ~ 83 ml/min  Plan: Vancomycin  1000mg  IV x1 dose approximately 12 hours after pre op dose,  Dose due at 22:00 Pharmacy signing off.     Height: 5\' 8"  (172.7 cm) Weight: 198 lb 10.2 oz (90.1 kg) IBW/kg (Calculated) : 68.4  Temp (24hrs), Avg:97.8 F (36.6 C), Min:97 F (36.1 C), Max:98.6 F (37 C)  Recent Labs  Lab 04/20/17 1941  WBC 6.2  CREATININE 1.03    Estimated Creatinine Clearance: 83.2 mL/min (by C-G formula based on SCr of 1.03 mg/dL).    Allergies  Allergen Reactions  . Penicillins Hives and Rash    Has patient had a PCN reaction causing immediate rash, facial/tongue/throat swelling, SOB or lightheadedness with hypotension: Yes Has patient had a PCN reaction causing severe rash involving mucus membranes or skin necrosis: No Has patient had a PCN reaction that required hospitalization: No Has patient had a PCN reaction occurring within the last 10 years: Yes If all of the above answers are "NO", then may proceed with Cephalosporin use.   . Fish Oil Nausea And Vomiting and Other (See Comments)    GI upset also   . Eggs Or Egg-Derived Products Nausea And Vomiting    Thank you for allowing pharmacy to be a part of this patient's care.   Nicole Cella, RPh Clinical Pharmacist Pager: [phone] 04/22/2017 2:05 PM

## 2017-04-22 NOTE — Op Note (Signed)
04/22/2017  12:30 PM  PATIENT:  Pedro [name]  62 y.o. male  PRE-OPERATIVE DIAGNOSIS:  Flexion distraction injury to C6-7 with perched facets  POST-OPERATIVE DIAGNOSIS:  same  PROCEDURE:  1. Decompressive anterior cervical discectomy C6-7, 2. Anterior cervical arthrodesis C6-7 utilizing a porous titanium interbody cage packed with locally harvested morcellized autologous bone graft, 3. Anterior cervical plating C6-7 utilizing a Alphatec plate with fixed angle screws  SURGEON:  Sherley Bounds, MD  ASSISTANTS: Dr. Arnoldo Morale  ANESTHESIA:   General  EBL: 100 ml  Total I/O In: 1000 [I.V.:1000] Out: 100 [Blood:100]  BLOOD ADMINISTERED: none  DRAINS: none  SPECIMEN:  none  INDICATION FOR PROCEDURE: This patient presented with flexion distraction injury to the C6-7 level after a fall. Imaging showed perched facets at C6-7 with kyphosis and ligamentous injury. Recommended ACDF with plating. Patient understood the risks, benefits, and alternatives and potential outcomes and wished to proceed.  PROCEDURE DETAILS: Patient was brought to the operating room placed under general endotracheal anesthesia. Patient was placed in the supine position on the operating room table. The neck was prepped with Duraprep and draped in a sterile fashion.   Three cc of local anesthesia was injected and a transverse incision was made on the right side of the neck.  Dissection was carried down thru the subcutaneous tissue and the platysma was  elevated, opened, and undermined with Metzenbaum scissors.  Dissection was then carried out thru an avascular plane leaving the sternocleidomastoid carotid artery and jugular vein laterally and the trachea and esophagus medially. The ventral aspect of the vertebral column was identified and a localizing x-ray was taken. The C6-7 level was identified. The longus colli muscles were then elevated and the retractor was placed. The annulus was incised and the disc space entered.  Distraction pins were placed into the vertebral bodies of C6 and C7 and the disc space was distracted. Discectomy was performed with micro-curettes and pituitary rongeurs. I then used the high-speed drill to drill the endplates down to the level of the posterior longitudinal ligament. The drill shavings were saved in a mucous trap for later arthrodesis. The operating microscope was draped and brought into the field provided additional magnification, illumination and visualization. Discectomy was continued posteriorly thru the disc space. Posterior longitudinal ligament was opened with a nerve hook, and then removed along with disc herniation and osteophytes, decompressing the spinal canal and thecal sac. We then continued to remove osteophytic overgrowth and disc material decompressing the neural foramina and exiting nerve roots bilaterally. The scope was angled up and down to help decompress and undercut the vertebral bodies. Once the decompression was completed we could pass a nerve hook circumferentially to assure adequate decompression in the midline and in the neural foramina. So by both visualization and palpation we felt we had an adequate decompression of the neural elements. We then used manual distraction also to finally get C6 to reduce over the C7 facets and there was an audible click. The C6 vertebral body was no longer subluxed. We then measured the height of the intravertebral disc space and selected a 8 millimeter PTi interbody cage packed with autograft. It was then gently positioned in the intravertebral disc space(s) and countersunk. I then used a Alphatec plate and placed fixed angle 16 mm screws into the vertebral bodies of each level and locked them into position. The wound was irrigated with bacitracin solution, checked for hemostasis which was established and confirmed. Once meticulous hemostasis was achieved, we then proceeded with  closure. The platysma was closed with interrupted 3-0 undyed  Vicryl suture, the subcuticular layer was closed with interrupted 3-0 undyed Vicryl suture. The skin edges were approximated with steristrips. The drapes were removed. A sterile dressing was applied. The patient was then awakened from general anesthesia and transferred to the recovery room in stable condition. At the end of the procedure all sponge, needle and instrument counts were correct.   PLAN OF CARE: Admit to inpatient   PATIENT DISPOSITION:  PACU - hemodynamically stable.   Delay start of Pharmacological VTE agent (>24hrs) due to surgical blood loss or risk of bleeding:  yes

## 2017-04-22 NOTE — Anesthesia Preprocedure Evaluation (Signed)
Anesthesia Evaluation  Patient identified by MRN, date of birth, ID band Patient awake    Reviewed: Allergy & Precautions, NPO status , Patient's Chart, lab work & pertinent test results  Airway Mallampati: II  TM Distance: >3 FB Neck ROM: Limited    Dental  (+) Teeth Intact, Dental Advisory Given   Pulmonary    breath sounds clear to auscultation       Cardiovascular hypertension, Pt. on medications  Rhythm:Regular Rate:Normal     Neuro/Psych negative neurological ROS  negative psych ROS   GI/Hepatic Neg liver ROS, GERD  Medicated and Controlled,  Endo/Other  diabetes, Type 2, Oral Hypoglycemic Agents  Renal/GU negative Renal ROS     Musculoskeletal negative musculoskeletal ROS (+)   Abdominal Normal abdominal exam  (+)   Peds  Hematology negative hematology ROS (+)   Anesthesia Other Findings   Reproductive/Obstetrics                             Lab Results  Component Value Date   WBC 6.2 04/20/2017   HGB 15.5 04/20/2017   HCT 43.7 04/20/2017   MCV 91.4 04/20/2017   PLT 264 04/20/2017   Lab Results  Component Value Date   CREATININE 1.03 04/20/2017   BUN 13 04/20/2017   NA 137 04/20/2017   K 4.0 04/20/2017   CL 103 04/20/2017   CO2 25 04/20/2017   Lab Results  Component Value Date   INR 1.04 04/20/2017   EKG: normal sinus rhythm.   Anesthesia Physical Anesthesia Plan  ASA: II  Anesthesia Plan: General   Post-op Pain Management:    Induction: Intravenous  PONV Risk Score and Plan: 3 and Ondansetron, Dexamethasone and Midazolam  Airway Management Planned: Oral ETT and Video Laryngoscope Planned  Additional Equipment: None  Intra-op Plan:   Post-operative Plan: Extubation in OR  Informed Consent: I have reviewed the patients History and Physical, chart, labs and discussed the procedure including the risks, benefits and alternatives for the proposed  anesthesia with the patient or authorized representative who has indicated his/her understanding and acceptance.   Dental advisory given  Plan Discussed with: CRNA  Anesthesia Plan Comments:         Anesthesia Quick Evaluation

## 2017-04-23 DIAGNOSIS — S12600A Unspecified displaced fracture of seventh cervical vertebra, initial encounter for closed fracture: Secondary | ICD-10-CM | POA: Diagnosis not present

## 2017-04-23 LAB — GLUCOSE, CAPILLARY: GLUCOSE-CAPILLARY: 161 mg/dL — AB (ref 65–99)

## 2017-04-23 MED ORDER — DOCUSATE SODIUM 100 MG PO CAPS: 100.0000 mg | ORAL_CAPSULE | Freq: Two times a day (BID) | ORAL | 0 refills | Status: DC

## 2017-04-23 MED ORDER — METHOCARBAMOL 500 MG PO TABS: 500.0000 mg | ORAL_TABLET | Freq: Four times a day (QID) | ORAL | 1 refills | Status: DC | PRN

## 2017-04-23 MED ORDER — HYDROCODONE-ACETAMINOPHEN 7.5-325 MG PO TABS: 1.0000 | ORAL_TABLET | ORAL | 0 refills | Status: DC | PRN

## 2017-04-23 NOTE — Discharge Summary (Signed)
Physician Discharge Summary  Patient ID: Pedro [name] MRN: [id number] DOB/AGE: 1955/07/07 62 y.o.  Admit date: 04/20/2017 Discharge date: 04/23/2017  Admission Diagnoses: C6-7 fracture subluxation  Discharge Diagnoses: The same Active Problems:   C7 cervical fracture (HCC)   S/P cervical spinal fusion   Discharged Condition: good  Hospital Course: Dr. Ronnald Ramp performed a C6-7 anterior cervical discectomy, fusion and plating on the patient on 04/20/2017.  The patient's postoperative course was unremarkable.  On postoperative day #1 the patient requested discharge to home.  He was given oral and written discharge instructions.  All his questions were answered.  Consults: None Significant Diagnostic Studies: None Treatments: C6-7 anterior cervical discectomy, fusion and plating. Discharge Exam: Blood pressure 125/68, pulse 61, temperature 97.7 F (36.5 C), temperature source Oral, resp. rate 16, height 5\' 8"  (1.727 m), weight 90.1 kg (198 lb 10.2 oz), SpO2 98 %. The patient is alert and pleasant.  He looks well.  His dressing is clean and dry.  There is no hematoma or shift.  His strength is normal.  Disposition: Home  Discharge Instructions    Call MD for:  difficulty breathing, headache or visual disturbances   Complete by:  As directed    Call MD for:  extreme fatigue   Complete by:  As directed    Call MD for:  hives   Complete by:  As directed    Call MD for:  persistant dizziness or light-headedness   Complete by:  As directed    Call MD for:  persistant nausea and vomiting   Complete by:  As directed    Call MD for:  redness, tenderness, or signs of infection (pain, swelling, redness, odor or green/yellow discharge around incision site)   Complete by:  As directed    Call MD for:  severe uncontrolled pain   Complete by:  As directed    Call MD for:  temperature >100.4   Complete by:  As directed    Diet - low sodium heart healthy   Complete by:  As directed    Discharge instructions   Complete by:  As directed    Call [phone] for a followup appointment. Take a stool softener while you are using pain medications.   Driving Restrictions   Complete by:  As directed    Do not drive for 2 weeks.   Increase activity slowly   Complete by:  As directed    Lifting restrictions   Complete by:  As directed    Do not lift more than 5 pounds. No excessive bending or twisting.   May shower / Bathe   Complete by:  As directed    Remove the dressing for 3 days after surgery.  You may shower, but leave the incision alone.   Remove dressing in 48 hours   Complete by:  As directed    Your stitches are under the scan and will dissolve by themselves. The Steri-Strips will fall off after you take a few showers. Do not rub back or pick at the wound, Leave the wound alone.     Allergies as of 04/23/2017      Reactions   Penicillins Hives, Rash   Has patient had a PCN reaction causing immediate rash, facial/tongue/throat swelling, SOB or lightheadedness with hypotension: Yes Has patient had a PCN reaction causing severe rash involving mucus membranes or skin necrosis: No Has patient had a PCN reaction that required hospitalization: No Has patient had a PCN reaction occurring within  the last 10 years: Yes If all of the above answers are "NO", then may proceed with Cephalosporin use.   Fish Oil Nausea And Vomiting, Other (See Comments)   GI upset also   Eggs Or Egg-derived Products Nausea And Vomiting      Medication List    TAKE these medications   aspirin 81 MG tablet Take 81 mg by mouth every evening.   docusate sodium 100 MG capsule Commonly known as:  COLACE Take 1 capsule (100 mg total) by mouth 2 (two) times daily.   fenofibrate 160 MG tablet TAKE 1 TABLET BY MOUTH  DAILY What changed:    how much to take  how to take this  when to take this   freestyle lancets 1 each by Other route 3 (three) times daily. PRN. E11.9   FREESTYLE LITE  test strip Generic drug:  glucose blood   HYDROcodone-acetaminophen 7.5-325 MG tablet Commonly known as:  NORCO Take 1 tablet by mouth every 4 (four) hours as needed for moderate pain ((score 4 to 6)).   lisinopril-hydrochlorothiazide 20-12.5 MG tablet Commonly known as:  PRINZIDE,ZESTORETIC TAKE 1 TABLET BY MOUTH  DAILY   metFORMIN 1000 MG tablet Commonly known as:  GLUCOPHAGE TAKE 1 TABLET BY MOUTH  TWICE A DAY WITH MEALS What changed:    how much to take  how to take this  when to take this   methocarbamol 500 MG tablet Commonly known as:  ROBAXIN Take 1 tablet (500 mg total) by mouth every 6 (six) hours as needed for muscle spasms.   omeprazole 40 MG capsule Commonly known as:  PRILOSEC TAKE 1 CAPSULE BY MOUTH  DAILY What changed:    how much to take  how to take this  when to take this   PRESERVISION AREDS 2 Caps Take 1 capsule by mouth 2 (two) times daily.   simvastatin 40 MG tablet Commonly known as:  ZOCOR TAKE 1 TABLET BY MOUTH AT  BEDTIME What changed:    how much to take  how to take this  when to take this        Signed: Ophelia Charter 04/23/2017, 8:54 AM

## 2017-04-24 ENCOUNTER — Encounter (HOSPITAL_COMMUNITY): Payer: Self-pay | Admitting: Neurological Surgery

## 2017-04-24 MED FILL — Gelatin Absorbable MT Powder: OROMUCOSAL | Qty: 1 | Status: AC

## 2017-04-24 MED FILL — Thrombin For Soln 5000 Unit: CUTANEOUS | Qty: 5000 | Status: AC

## 2017-05-03 ENCOUNTER — Encounter: Payer: Self-pay | Admitting: Family Medicine

## 2017-05-03 ENCOUNTER — Ambulatory Visit: Payer: 59 | Admitting: Family Medicine

## 2017-05-03 ENCOUNTER — Other Ambulatory Visit: Payer: Self-pay

## 2017-05-03 VITALS — BP 120/80 | HR 78 | Temp 98.2°F | Resp 16 | Ht 67.0 in | Wt 193.1 lb

## 2017-05-03 DIAGNOSIS — S12600D Unspecified displaced fracture of seventh cervical vertebra, subsequent encounter for fracture with routine healing: Secondary | ICD-10-CM | POA: Diagnosis not present

## 2017-05-03 DIAGNOSIS — E1169 Type 2 diabetes mellitus with other specified complication: Secondary | ICD-10-CM

## 2017-05-03 DIAGNOSIS — E119 Type 2 diabetes mellitus without complications: Secondary | ICD-10-CM | POA: Diagnosis not present

## 2017-05-03 DIAGNOSIS — I10 Essential (primary) hypertension: Secondary | ICD-10-CM | POA: Diagnosis not present

## 2017-05-03 DIAGNOSIS — E785 Hyperlipidemia, unspecified: Secondary | ICD-10-CM | POA: Diagnosis not present

## 2017-05-03 LAB — CBC WITH DIFFERENTIAL/PLATELET
Basophils Absolute: 0.1 10*3/uL (ref 0.0–0.1)
Basophils Relative: 0.9 % (ref 0.0–3.0)
EOS PCT: 2.3 % (ref 0.0–5.0)
Eosinophils Absolute: 0.2 10*3/uL (ref 0.0–0.7)
HCT: 43.3 % (ref 39.0–52.0)
Hemoglobin: 15.1 g/dL (ref 13.0–17.0)
Lymphocytes Relative: 29.9 % (ref 12.0–46.0)
Lymphs Abs: 2.5 10*3/uL (ref 0.7–4.0)
MCHC: 34.8 g/dL (ref 30.0–36.0)
MCV: 91 fl (ref 78.0–100.0)
MONOS PCT: 9.9 % (ref 3.0–12.0)
Monocytes Absolute: 0.8 10*3/uL (ref 0.1–1.0)
Neutro Abs: 4.8 10*3/uL (ref 1.4–7.7)
Neutrophils Relative %: 57 % (ref 43.0–77.0)
Platelets: 393 10*3/uL (ref 150.0–400.0)
RBC: 4.75 Mil/uL (ref 4.22–5.81)
RDW: 11.9 % (ref 11.5–15.5)
WBC: 8.5 10*3/uL (ref 4.0–10.5)

## 2017-05-03 LAB — BASIC METABOLIC PANEL
BUN: 18 mg/dL (ref 6–23)
CO2: 26 mEq/L (ref 19–32)
CREATININE: 1.02 mg/dL (ref 0.40–1.50)
Calcium: 9.8 mg/dL (ref 8.4–10.5)
Chloride: 98 mEq/L (ref 96–112)
GFR: 79.05 mL/min (ref >60.00)
Glucose, Bld: 194 mg/dL — ABNORMAL HIGH (ref 70–99)
Potassium: 4.1 mEq/L (ref 3.5–5.1)
Sodium: 133 mEq/L — ABNORMAL LOW (ref 135–145)

## 2017-05-03 LAB — HEPATIC FUNCTION PANEL
ALBUMIN: 4.2 g/dL (ref 3.5–5.2)
ALT: 14 U/L (ref 0–53)
AST: 12 U/L (ref 0–37)
Alkaline Phosphatase: 28 U/L — ABNORMAL LOW (ref 39–117)
Bilirubin, Direct: 0.1 mg/dL (ref 0.0–0.3)
Total Bilirubin: 0.4 mg/dL (ref 0.2–1.2)
Total Protein: 6.9 g/dL (ref 6.0–8.3)

## 2017-05-03 LAB — LDL CHOLESTEROL, DIRECT: LDL DIRECT: 71 mg/dL

## 2017-05-03 LAB — LIPID PANEL
CHOL/HDL RATIO: 4
CHOLESTEROL: 126 mg/dL (ref 0–200)
HDL: 35.1 mg/dL — ABNORMAL LOW (ref >39.00)
NonHDL: 90.48
TRIGLYCERIDES: 244 mg/dL — AB (ref 0.0–149.0)
VLDL: 48.8 mg/dL — ABNORMAL HIGH (ref 0.0–40.0)

## 2017-05-03 LAB — TSH: TSH: 1.92 u[IU]/mL (ref 0.35–4.50)

## 2017-05-03 LAB — HEMOGLOBIN A1C: Hgb A1c MFr Bld: 8.1 % — ABNORMAL HIGH (ref 4.6–6.5)

## 2017-05-03 NOTE — Patient Instructions (Addendum)
Schedule your complete physical for after 9/6 We'll notify you of your lab results and make any changes if needed Call and schedule your eye exam! Call with any questions or concerns You are a walking, talking miracle!!!

## 2017-05-03 NOTE — Assessment & Plan Note (Signed)
New to provider.  Pt is s/p cervical fusion/fixation.  Pt reports feeling very well.  No neuro deficit.

## 2017-05-03 NOTE — Progress Notes (Signed)
   Subjective:    Patient ID: Pedro [name], male    DOB: [birth year], 62 y.o.   MRN: [id number]  HPI DM- chronic problem, on Metformin 1000mg  BID.  On ACE for renal protection.  Denies symptomatic lows.  Overdue for eye exam- plan is in the next month.  No numbness/tingling of hands/feet.  HTN- chronic problem, on Lisinopril HCTZ [name] w/ good control.  Denies CP, SOB, HAs, visual changes, edema.  Hyperlipidemia- chronic problem, on Fenofibrate 160mg  and Simvastatin 40mg  [name] w/ hx of good control.  Denies abd pain, N/V, myalgia.  C7 fx- pt fell on 3/25 and fx was not noted until 3 days later.  Pt had cervical fusion.   Review of Systems For ROS see HPI     Objective:   Physical Exam  Constitutional: He is oriented to person, place, and time. He appears well-developed and well-nourished. No distress.  HENT:  Head: Normocephalic and atraumatic.  Eyes: Pupils are equal, round, and reactive to light. Conjunctivae and EOM are normal.  Neck: No thyromegaly present.  Healing anterior neck wound  Cardiovascular: Normal rate, regular rhythm, normal heart sounds and intact distal pulses.  No murmur heard. Pulmonary/Chest: Effort normal and breath sounds normal. No respiratory distress.  Abdominal: Soft. Bowel sounds are normal. He exhibits no distension.  Musculoskeletal: He exhibits no edema.  Lymphadenopathy:    He has no cervical adenopathy.  Neurological: He is alert and oriented to person, place, and time. No cranial nerve deficit.  Skin: Skin is warm and dry.  Psychiatric: He has a normal mood and affect. His behavior is normal.  Vitals reviewed.         Assessment & Plan:

## 2017-05-03 NOTE — Assessment & Plan Note (Signed)
Chronic problem.  Well controlled on Lisinopril HCTZ daily.  Asymptomatic.  Check labs.  No anticipated med changes.  Will follow.

## 2017-05-03 NOTE — Assessment & Plan Note (Signed)
Chronic problem.  On ACE for renal protection.  Foot exam done today.  Overdue for eye exam.  Pt to schedule.  If A1C is not at goal, pt is interested in starting SGLT2.  Check labs.  Adjust meds prn

## 2017-05-03 NOTE — Assessment & Plan Note (Signed)
Chronic problem.  Tolerating statin and fenofibrate w/o difficulty.  Check labs.  Adjust meds prn  

## 2017-05-04 ENCOUNTER — Other Ambulatory Visit: Payer: Self-pay | Admitting: Emergency Medicine

## 2017-05-04 MED ORDER — EMPAGLIFLOZIN 10 MG PO TABS: 10.0000 mg | ORAL_TABLET | Freq: Every day | ORAL | 2 refills | Status: DC

## 2017-05-09 ENCOUNTER — Encounter: Payer: Self-pay | Admitting: Family Medicine

## 2017-05-09 ENCOUNTER — Telehealth: Payer: Self-pay | Admitting: Family Medicine

## 2017-05-09 ENCOUNTER — Other Ambulatory Visit: Payer: Self-pay | Admitting: General Practice

## 2017-05-09 MED ORDER — FREESTYLE LITE TEST VI STRP: ORAL_STRIP | 12 refills | Status: AC

## 2017-05-09 NOTE — Telephone Encounter (Signed)
Office has sent this in for patient- (e-mail requested)

## 2017-05-09 NOTE — Telephone Encounter (Signed)
°>>   May 09, 2017 11:42 AM Oneta Rack wrote:  Relation to pt: wife / Harriet Masson Call back [place]: [place], [place]. [phone] (Phone) [phone] (Fax)  Reason for call:  Wife requesting free style test strips only, patient contacted pharmacy and pharmacy informed patient to call PCP office directly due to prescription not being prescribed in awhile, please advise

## 2017-05-14 ENCOUNTER — Other Ambulatory Visit: Payer: Self-pay | Admitting: Family Medicine

## 2017-05-14 ENCOUNTER — Encounter: Payer: Self-pay | Admitting: Family Medicine

## 2017-05-15 ENCOUNTER — Other Ambulatory Visit: Payer: Self-pay

## 2017-05-15 MED ORDER — OMEPRAZOLE 40 MG PO CPDR: DELAYED_RELEASE_CAPSULE | ORAL | 1 refills | Status: DC

## 2017-05-15 MED ORDER — SIMVASTATIN 40 MG PO TABS: ORAL_TABLET | ORAL | 1 refills | Status: DC

## 2017-05-15 MED ORDER — LISINOPRIL-HYDROCHLOROTHIAZIDE 20-12.5 MG PO TABS: 1.0000 | ORAL_TABLET | Freq: Every day | ORAL | 1 refills | Status: DC

## 2017-05-15 MED ORDER — FENOFIBRATE 160 MG PO TABS: ORAL_TABLET | ORAL | 0 refills | Status: DC

## 2017-05-17 ENCOUNTER — Other Ambulatory Visit: Payer: Self-pay | Admitting: Emergency Medicine

## 2017-05-17 MED ORDER — EMPAGLIFLOZIN 10 MG PO TABS: 10.0000 mg | ORAL_TABLET | Freq: Every day | ORAL | 1 refills | Status: DC

## 2017-05-25 DIAGNOSIS — H353131 Nonexudative age-related macular degeneration, bilateral, early dry stage: Secondary | ICD-10-CM | POA: Diagnosis not present

## 2017-05-25 DIAGNOSIS — E119 Type 2 diabetes mellitus without complications: Secondary | ICD-10-CM | POA: Diagnosis not present

## 2017-05-25 DIAGNOSIS — H2513 Age-related nuclear cataract, bilateral: Secondary | ICD-10-CM | POA: Diagnosis not present

## 2017-05-25 LAB — HM DIABETES EYE EXAM

## 2017-06-09 ENCOUNTER — Encounter: Payer: Self-pay | Admitting: General Practice

## 2017-07-05 ENCOUNTER — Ambulatory Visit: Payer: 59 | Admitting: Physician Assistant

## 2017-07-05 ENCOUNTER — Encounter: Payer: Self-pay | Admitting: Physician Assistant

## 2017-07-05 ENCOUNTER — Other Ambulatory Visit: Payer: Self-pay

## 2017-07-05 VITALS — BP 130/80 | HR 89 | Temp 99.0°F | Resp 16 | Ht 67.0 in | Wt 198.0 lb

## 2017-07-05 DIAGNOSIS — B9789 Other viral agents as the cause of diseases classified elsewhere: Secondary | ICD-10-CM

## 2017-07-05 DIAGNOSIS — J069 Acute upper respiratory infection, unspecified: Secondary | ICD-10-CM

## 2017-07-05 MED ORDER — FLUTICASONE PROPIONATE 50 MCG/ACT NA SUSP: 2.0000 | Freq: Every day | NASAL | 0 refills | Status: DC

## 2017-07-05 NOTE — Patient Instructions (Signed)
Symptoms and examination seem most consistent with a viral upper respiratory infection.   Keep well hydrated and get plenty of rest.  Start a Mucinex-DM supplement. Start the Flonase once daily. I also recommend saline nasal rinses once per day.   Symptoms should level out and start to improve over the next 3-4 days. If not, or you note any new or worsening symptoms, please give me a call.

## 2017-07-05 NOTE — Progress Notes (Signed)
Patient presents to clinic today c/o 1.5 days of scratchy throat, nasal congestion, PND, watery eyes, cough. Denies fevers, chills, sinus pain or ear pain. Notes some ear pressure. Denies recent travel. Denies sick contact. Denies prior history of seasonal allergies.   Past Medical History:  Diagnosis Date  . Diabetes mellitus    2  . Diverticulosis   . GERD (gastroesophageal reflux disease)   . Gilbert syndrome   . Hyperlipidemia   . Hypertension   . Internal hemorrhoid   . Pancreatitis   . Pneumonia   . 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 aspirin 81 MG tablet Take 81 mg by mouth every evening.     . empagliflozin (JARDIANCE) 10 MG TABS tablet Take 10 mg by mouth daily. 90 tablet 1  . fenofibrate 160 MG tablet Take 160 mg by mouth once a day 90 tablet 0  . fenofibrate 160 MG tablet Take 160 mg by mouth once a day 90 tablet 0  . FREESTYLE LITE test strip Use on strip to test sugars 1-2 times daily. Dx. E11.9 50 each 12  . Lancets (FREESTYLE) lancets 1 each by Other route 3 (three) times daily. PRN. E11.9 100 each 0  . lisinopril-hydrochlorothiazide (PRINZIDE,ZESTORETIC) 20-12.5 MG tablet TAKE 1 TABLET BY MOUTH  DAILY 90 tablet 1  . metFORMIN (GLUCOPHAGE) 1000 MG tablet TAKE 1 TABLET BY MOUTH  TWICE A DAY WITH MEALS (Patient taking differently: Take 1,000 mg by mouth two times a day with meals) 180 tablet 1  . Multiple Vitamins-Minerals (PRESERVISION AREDS 2) CAPS Take 1 capsule by mouth 2 (two) times daily.    Marland Kitchen omeprazole (PRILOSEC) 40 MG capsule Take 40 mg by mouth in the evening 90 capsule 1  . simvastatin (ZOCOR) 40 MG tablet Take 40 mg by mouth in the evening 90 tablet 1  . HYDROcodone-acetaminophen (NORCO) 7.5-325 MG tablet Take 1 tablet by mouth every 4 (four) hours as needed for moderate pain ((score 4 to 6)). (Patient not taking: Reported on 07/05/2017) 30 tablet 0  .  methocarbamol (ROBAXIN) 500 MG tablet Take 1 tablet (500 mg total) by mouth every 6 (six) hours as needed for muscle spasms. (Patient not taking: Reported on 07/05/2017) 50 tablet 1   No current facility-administered medications on file prior to visit.     Allergies  Allergen Reactions  . Penicillins Hives and Rash    Has patient had a PCN reaction causing immediate rash, facial/tongue/throat swelling, SOB or lightheadedness with hypotension: Yes Has patient had a PCN reaction causing severe rash involving mucus membranes or skin necrosis: No Has patient had a PCN reaction that required hospitalization: No Has patient had a PCN reaction occurring within the last 10 years: Yes If all of the above answers are "NO", then may proceed with Cephalosporin use.   . Fish Oil Nausea And Vomiting and Other (See Comments)    GI upset also   . Eggs Or Egg-Derived Products Nausea And Vomiting    Family History  Problem Relation Age of Onset  . Heart disease Father   . Diabetes Paternal Aunt   . Breast cancer Sister        x 2  . Colon cancer Neg Hx     Social History   Socioeconomic History  . Marital status: Married    Spouse name: Not on file  . Number of children: 2  .  Years of education: Not on file  . Highest education level: Not on file  Occupational History  . Occupation: truck Animator Needs  . Financial resource strain: Not on file  . Food insecurity:    Worry: Not on file    Inability: Not on file  . Transportation needs:    Medical: Not on file    Non-medical: Not on file  Tobacco Use  . Smoking status: Never Smoker  . Smokeless tobacco: Never Used  Substance and Sexual Activity  . Alcohol use: No  . Drug use: No  . Sexual activity: Not on file  Lifestyle  . Physical activity:    Days per week: Not on file    Minutes per session: Not on file  . Stress: Not on file  Relationships  . Social connections:    Talks on phone: Not on file    Gets together: Not  on file    Attends religious service: Not on file    Active member of club or organization: Not on file    Attends meetings of clubs or organizations: Not on file    Relationship status: Not on file  Other Topics Concern  . Not on file  Social History Narrative  . Not on file    Review of Systems - See HPI.  All other ROS are negative.  BP 130/80   Pulse 89   Temp 99 F (37.2 C) (Oral)   Resp 16   Ht '5\' 7"'$  (1.702 m)   Wt 198 lb (89.8 kg)   SpO2 98%   BMI 31.01 kg/m   Physical Exam  Constitutional: He is oriented to person, place, and time. He appears well-developed and well-nourished.  HENT:  Head: Normocephalic and atraumatic.  Eyes: Conjunctivae are normal.  Neck: Neck supple.  Cardiovascular: Normal rate, regular rhythm and normal heart sounds.  Pulmonary/Chest: Effort normal.  Lymphadenopathy:    He has no cervical adenopathy.  Neurological: He is alert and oriented to person, place, and time.  Skin: No rash noted.  Vitals reviewed.   Recent Results (from the past 2160 hour(s))  Basic metabolic panel     Status: Abnormal   Collection Time: 04/20/17  7:41 PM  Result Value Ref Range   Sodium 137 135 - 145 mmol/L   Potassium 4.0 3.5 - 5.1 mmol/L   Chloride 103 101 - 111 mmol/L   CO2 25 22 - 32 mmol/L   Glucose, Bld 160 (H) 65 - 99 mg/dL   BUN 13 6 - 20 mg/dL   Creatinine, Ser 1.03 0.61 - 1.24 mg/dL   Calcium 9.1 8.9 - 10.3 mg/dL   GFR calc non Af Amer >60 >60 mL/min   GFR calc Af Amer >60 >60 mL/min    Comment: (NOTE) The eGFR has been calculated using the CKD EPI equation. This calculation has not been validated in all clinical situations. eGFR's persistently <60 mL/min signify possible Chronic Kidney Disease.    Anion gap 9 5 - 15    Comment: Performed at [facility] [address], [place], [place] [address]  CBC WITH DIFFERENTIAL     Status: None   Collection Time: 04/20/17  7:41 PM  Result Value Ref Range   WBC 6.2 4.0 - 10.5 K/uL   RBC  4.78 4.22 - 5.81 MIL/uL   Hemoglobin 15.5 13.0 - 17.0 g/dL   HCT 43.7 39.0 - 52.0 %   MCV 91.4 78.0 - 100.0 fL   MCH  32.4 26.0 - 34.0 pg   MCHC 35.5 30.0 - 36.0 g/dL   RDW 12.1 11.5 - 15.5 %   Platelets 264 150 - 400 K/uL   Neutrophils Relative % 45 %   Neutro Abs 2.9 1.7 - 7.7 K/uL   Lymphocytes Relative 41 %   Lymphs Abs 2.5 0.7 - 4.0 K/uL   Monocytes Relative 10 %   Monocytes Absolute 0.6 0.1 - 1.0 K/uL   Eosinophils Relative 3 %   Eosinophils Absolute 0.2 0.0 - 0.7 K/uL   Basophils Relative 1 %   Basophils Absolute 0.0 0.0 - 0.1 K/uL    Comment: Performed at [facility] [address] [place], [place] [address]  Protime-INR     Status: None   Collection Time: 04/20/17  7:41 PM  Result Value Ref Range   Prothrombin Time 13.5 11.4 - 15.2 seconds   INR 1.04     Comment: Performed at [facility] [address] [place], [place] [address]  Glucose, capillary     Status: Abnormal   Collection Time: 04/20/17  9:27 PM  Result Value Ref Range   Glucose-Capillary 175 (H) 65 - 99 mg/dL   Comment 1 Notify RN    Comment 2 Document in Chart   Surgical pcr screen     Status: Abnormal   Collection Time: 04/20/17 10:41 PM  Result Value Ref Range   MRSA, PCR NEGATIVE NEGATIVE   Staphylococcus aureus POSITIVE (A) NEGATIVE    Comment: (NOTE) The Xpert SA Assay (FDA approved for NASAL specimens in patients 61 years of age and older), is one component of a comprehensive surveillance program. It is not intended to diagnose infection nor to guide or monitor treatment. Performed at [facility], [place] [address] [place], [place] [address]   Glucose, capillary     Status: Abnormal   Collection Time: 04/21/17  6:14 AM  Result Value Ref Range   Glucose-Capillary 190 (H) 65 - 99 mg/dL   Comment 1 Notify RN    Comment 2 Document in Chart   HIV antibody (Routine Testing)     Status: None   Collection Time: 04/21/17  7:15 AM  Result Value Ref Range   HIV Screen 4th  Generation wRfx Non Reactive Non Reactive    Comment: (NOTE) Performed At: [facility] [place], [place] [id number] Rush Farmer MD [id number] Performed at [facility], [place] [address], [place], [place] [address]   Glucose, capillary     Status: Abnormal   Collection Time: 04/21/17 11:23 AM  Result Value Ref Range   Glucose-Capillary 198 (H) 65 - 99 mg/dL  Glucose, capillary     Status: Abnormal   Collection Time: 04/21/17  4:18 PM  Result Value Ref Range   Glucose-Capillary 153 (H) 65 - 99 mg/dL   Comment 1 Notify RN    Comment 2 Document in Chart   Glucose, capillary     Status: Abnormal   Collection Time: 04/21/17  9:15 PM  Result Value Ref Range   Glucose-Capillary 137 (H) 65 - 99 mg/dL   Comment 1 Notify RN    Comment 2 Document in Chart   Glucose, capillary     Status: Abnormal   Collection Time: 04/22/17  6:31 AM  Result Value Ref Range   Glucose-Capillary 179 (H) 65 - 99 mg/dL   Comment 1 Notify RN    Comment 2 Document in Chart   Glucose, capillary  Status: Abnormal   Collection Time: 04/22/17  9:31 AM  Result Value Ref Range   Glucose-Capillary 155 (H) 65 - 99 mg/dL  Glucose, capillary     Status: Abnormal   Collection Time: 04/22/17 12:38 PM  Result Value Ref Range   Glucose-Capillary 225 (H) 65 - 99 mg/dL  Glucose, capillary     Status: Abnormal   Collection Time: 04/22/17  4:20 PM  Result Value Ref Range   Glucose-Capillary 255 (H) 65 - 99 mg/dL  Glucose, capillary     Status: Abnormal   Collection Time: 04/22/17  9:25 PM  Result Value Ref Range   Glucose-Capillary 192 (H) 65 - 99 mg/dL  Glucose, capillary     Status: Abnormal   Collection Time: 04/23/17  6:04 AM  Result Value Ref Range   Glucose-Capillary 161 (H) 65 - 99 mg/dL  Hemoglobin A1c     Status: Abnormal   Collection Time: 05/03/17  2:38 PM  Result Value Ref Range   Hgb A1c MFr Bld 8.1 (H) 4.6 - 6.5 %    Comment: Glycemic Control Guidelines for People  with Diabetes:Non Diabetic:  <6%Goal of Therapy: <7%Additional Action Suggested:  >8%   Lipid panel     Status: Abnormal   Collection Time: 05/03/17  2:38 PM  Result Value Ref Range   Cholesterol 126 0 - 200 mg/dL    Comment: ATP III Classification       Desirable:  < 200 mg/dL               Borderline High:  200 - 239 mg/dL          High:  > = 240 mg/dL   Triglycerides 244.0 (H) 0.0 - 149.0 mg/dL    Comment: Normal:  <150 mg/dLBorderline High:  150 - 199 mg/dL   HDL 35.10 (L) >39.00 mg/dL   VLDL 48.8 (H) 0.0 - 40.0 mg/dL   Total CHOL/HDL Ratio 4     Comment:                Men          Women1/2 Average Risk     3.4          3.3Average Risk          5.0          4.42X Average Risk          9.6          7.13X Average Risk          15.0          11.0                       NonHDL 90.48     Comment: NOTE:  Non-HDL goal should be 30 mg/dL higher than patient's LDL goal (i.e. LDL goal of < 70 mg/dL, would have non-HDL goal of < 100 mg/dL)  Basic metabolic panel     Status: Abnormal   Collection Time: 05/03/17  2:38 PM  Result Value Ref Range   Sodium 133 (L) 135 - 145 mEq/L   Potassium 4.1 3.5 - 5.1 mEq/L   Chloride 98 96 - 112 mEq/L   CO2 26 19 - 32 mEq/L   Glucose, Bld 194 (H) 70 - 99 mg/dL   BUN 18 6 - 23 mg/dL   Creatinine, Ser 1.02 0.40 - 1.50 mg/dL   Calcium 9.8 8.4 - 10.5 mg/dL   GFR 79.05 >60.00 mL/min  TSH     Status: None   Collection Time: 05/03/17  2:38 PM  Result Value Ref Range   TSH 1.92 0.35 - 4.50 uIU/mL  Hepatic function panel     Status: Abnormal   Collection Time: 05/03/17  2:38 PM  Result Value Ref Range   Total Bilirubin 0.4 0.2 - 1.2 mg/dL   Bilirubin, Direct 0.1 0.0 - 0.3 mg/dL   Alkaline Phosphatase 28 (L) 39 - 117 U/L   AST 12 0 - 37 U/L   ALT 14 0 - 53 U/L   Total Protein 6.9 6.0 - 8.3 g/dL   Albumin 4.2 3.5 - 5.2 g/dL  CBC with Differential/Platelet     Status: None   Collection Time: 05/03/17  2:38 PM  Result Value Ref Range   WBC 8.5 4.0 - 10.5 K/uL     RBC 4.75 4.22 - 5.81 Mil/uL   Hemoglobin 15.1 13.0 - 17.0 g/dL   HCT 43.3 39.0 - 52.0 %   MCV 91.0 78.0 - 100.0 fl   MCHC 34.8 30.0 - 36.0 g/dL   RDW 11.9 11.5 - 15.5 %   Platelets 393.0 150.0 - 400.0 K/uL   Neutrophils Relative % 57.0 43.0 - 77.0 %   Lymphocytes Relative 29.9 12.0 - 46.0 %   Monocytes Relative 9.9 3.0 - 12.0 %   Eosinophils Relative 2.3 0.0 - 5.0 %   Basophils Relative 0.9 0.0 - 3.0 %   Neutro Abs 4.8 1.4 - 7.7 K/uL   Lymphs Abs 2.5 0.7 - 4.0 K/uL   Monocytes Absolute 0.8 0.1 - 1.0 K/uL   Eosinophils Absolute 0.2 0.0 - 0.7 K/uL   Basophils Absolute 0.1 0.0 - 0.1 K/uL  LDL cholesterol, direct     Status: None   Collection Time: 05/03/17  2:38 PM  Result Value Ref Range   Direct LDL 71.0 mg/dL    Comment: Optimal:  <100 mg/dLNear or Above Optimal:  100-129 mg/dLBorderline High:  130-159 mg/dLHigh:  160-189 mg/dLVery High:  >190 mg/dL  HM DIABETES EYE EXAM     Status: None   Collection Time: 05/25/17 12:00 AM  Result Value Ref Range   HM Diabetic Eye Exam No Retinopathy No Retinopathy    Assessment/Plan: 1. Viral URI with cough Examination unremarkable. Viral etiology most likely. Also discussed potential of allergic inflammation. Supportive measures and OTC medications reviewed. Start Flonase daily. Follow-up if symptoms are not resolved. - fluticasone (FLONASE) 50 MCG/ACT nasal spray; Place 2 sprays into both nostrils daily.  Dispense: 16 g; Refill: 0   Leeanne Rio, PA-C

## 2017-07-10 ENCOUNTER — Telehealth: Payer: Self-pay | Admitting: Physician Assistant

## 2017-07-10 MED ORDER — DOXYCYCLINE HYCLATE 100 MG PO CAPS: 100.0000 mg | ORAL_CAPSULE | Freq: Two times a day (BID) | ORAL | 0 refills | Status: DC

## 2017-07-10 NOTE — Telephone Encounter (Signed)
Spoke with patient spouse [name] about rx for antibiotic sent to the pharmacy for patient. She understands and will pick rx up for patient to start.

## 2017-07-10 NOTE — Telephone Encounter (Signed)
Received mychart message from wife [name] concerning Mr. Pedro [name]'s continued symptoms. Please call patient to let him know I have sent in an antibiotic for him to take as directed. Continue care discussed at visit. If not improving with the antibiotic, have him return for evaluation.

## 2017-07-30 ENCOUNTER — Other Ambulatory Visit: Payer: Self-pay | Admitting: Family Medicine

## 2017-08-04 ENCOUNTER — Ambulatory Visit: Payer: 59 | Admitting: Family Medicine

## 2017-09-04 ENCOUNTER — Other Ambulatory Visit: Payer: Self-pay | Admitting: Family Medicine

## 2017-09-18 ENCOUNTER — Telehealth: Payer: Self-pay | Admitting: Family Medicine

## 2017-09-18 NOTE — Telephone Encounter (Signed)
Pt will need a new prescription for Lancets (FREESTYLE) lancets. Last prescription on file was written on 09/15/14. Pt did contact the pharmacy and was told RX was too old to refill.   LOV: 05/03/17 PCP: Dr. Birdie Riddle Pharmacy: [facility]

## 2017-09-18 NOTE — Telephone Encounter (Signed)
Copied from [place] [id number]. Topic: Quick Communication - Rx Refill/Question >> Sep 18, 2017  4:43 PM Reyne Dumas L wrote: Medication: Lancets (FREESTYLE) lancets  Has the patient contacted their pharmacy? Yes - states RX was too old to refill (Agent: If no, request that the patient contact the pharmacy for the refill.) (Agent: If yes, when and what did the pharmacy advise?)  Preferred Pharmacy (with phone number or street name): [facility] - [place], [place]. [phone] (Phone) [phone] (Fax)  Agent: Please be advised that RX refills may take up to 3 business days. We ask that you follow-up with your pharmacy.

## 2017-09-19 MED ORDER — FREESTYLE LANCETS MISC: 1.0000 | Freq: Three times a day (TID) | 6 refills | Status: AC

## 2017-09-29 ENCOUNTER — Ambulatory Visit (INDEPENDENT_AMBULATORY_CARE_PROVIDER_SITE_OTHER): Payer: 59 | Admitting: Physician Assistant

## 2017-09-29 ENCOUNTER — Other Ambulatory Visit: Payer: Self-pay

## 2017-09-29 ENCOUNTER — Encounter: Payer: Self-pay | Admitting: Physician Assistant

## 2017-09-29 VITALS — BP 112/70 | HR 66 | Temp 97.8°F | Resp 14 | Ht 67.0 in | Wt 185.0 lb

## 2017-09-29 DIAGNOSIS — E785 Hyperlipidemia, unspecified: Secondary | ICD-10-CM | POA: Diagnosis not present

## 2017-09-29 DIAGNOSIS — E1169 Type 2 diabetes mellitus with other specified complication: Secondary | ICD-10-CM | POA: Diagnosis not present

## 2017-09-29 DIAGNOSIS — L570 Actinic keratosis: Secondary | ICD-10-CM | POA: Insufficient documentation

## 2017-09-29 DIAGNOSIS — Z125 Encounter for screening for malignant neoplasm of prostate: Secondary | ICD-10-CM

## 2017-09-29 DIAGNOSIS — I10 Essential (primary) hypertension: Secondary | ICD-10-CM

## 2017-09-29 DIAGNOSIS — Z0000 Encounter for general adult medical examination without abnormal findings: Secondary | ICD-10-CM | POA: Insufficient documentation

## 2017-09-29 DIAGNOSIS — E119 Type 2 diabetes mellitus without complications: Secondary | ICD-10-CM

## 2017-09-29 LAB — CBC WITH DIFFERENTIAL/PLATELET
BASOS PCT: 0.6 % (ref 0.0–3.0)
Basophils Absolute: 0 10*3/uL (ref 0.0–0.1)
EOS ABS: 0.1 10*3/uL (ref 0.0–0.7)
Eosinophils Relative: 2.5 % (ref 0.0–5.0)
HEMATOCRIT: 49.6 % (ref 39.0–52.0)
Hemoglobin: 17 g/dL (ref 13.0–17.0)
LYMPHS ABS: 2 10*3/uL (ref 0.7–4.0)
LYMPHS PCT: 35.7 % (ref 12.0–46.0)
MCHC: 34.3 g/dL (ref 30.0–36.0)
MCV: 91.2 fl (ref 78.0–100.0)
Monocytes Absolute: 0.7 10*3/uL (ref 0.1–1.0)
Monocytes Relative: 12 % (ref 3.0–12.0)
NEUTROS ABS: 2.8 10*3/uL (ref 1.4–7.7)
Neutrophils Relative %: 49.2 % (ref 43.0–77.0)
Platelets: 270 10*3/uL (ref 150.0–400.0)
RBC: 5.44 Mil/uL (ref 4.22–5.81)
RDW: 12.9 % (ref 11.5–15.5)
WBC: 5.6 10*3/uL (ref 4.0–10.5)

## 2017-09-29 LAB — LIPID PANEL
Cholesterol: 129 mg/dL (ref 0–200)
HDL: 41 mg/dL (ref >39.00)
LDL Cholesterol: 53 mg/dL (ref 0–99)
NonHDL: 88.15
Total CHOL/HDL Ratio: 3
Triglycerides: 174 mg/dL — ABNORMAL HIGH (ref 0.0–149.0)
VLDL: 34.8 mg/dL (ref 0.0–40.0)

## 2017-09-29 LAB — COMPREHENSIVE METABOLIC PANEL
ALT: 19 U/L (ref 0–53)
AST: 18 U/L (ref 0–37)
Albumin: 4.6 g/dL (ref 3.5–5.2)
Alkaline Phosphatase: 23 U/L — ABNORMAL LOW (ref 39–117)
BUN: 28 mg/dL — ABNORMAL HIGH (ref 6–23)
CO2: 29 mEq/L (ref 19–32)
Calcium: 10.1 mg/dL (ref 8.4–10.5)
Chloride: 101 mEq/L (ref 96–112)
Creatinine, Ser: 1.23 mg/dL (ref 0.40–1.50)
GFR: 63.6 mL/min (ref >60.00)
Glucose, Bld: 115 mg/dL — ABNORMAL HIGH (ref 70–99)
Potassium: 4.9 mEq/L (ref 3.5–5.1)
Sodium: 138 mEq/L (ref 135–145)
Total Bilirubin: 0.9 mg/dL (ref 0.2–1.2)
Total Protein: 7 g/dL (ref 6.0–8.3)

## 2017-09-29 LAB — PSA: PSA: 0.37 ng/mL (ref 0.10–4.00)

## 2017-09-29 LAB — HEMOGLOBIN A1C: Hgb A1c MFr Bld: 6.9 % — ABNORMAL HIGH (ref 4.6–6.5)

## 2017-09-29 NOTE — Assessment & Plan Note (Signed)
Referral back to [facility] for comprehensive skin assessment and treatment. Skin care/protection reviewed with patient.

## 2017-09-29 NOTE — Assessment & Plan Note (Signed)
Depression screen negative. Health Maintenance reviewed -- Declines flu shot. Tetanus up-to-date. Preventive schedule discussed and handout given in AVS. Will obtain fasting labs today.  

## 2017-09-29 NOTE — Progress Notes (Signed)
Patient presents to clinic today for annual exam.  Patient is fasting for labs.  Acute Concerns: Patient notes a scaly region of his face that is non-healing. Denies pain. Rarely pruritic. States it feels rough.   Chronic Issues: Hypertension -- Patient is currently on a regimen of lisinopril-HCTZ. Endorses taking daily as directed and tolerating well. BP has been controlled on this regimen. Patient denies chest pain, palpitations, lightheadedness, dizziness, vision changes or frequent headaches.  BP Readings from Last 3 Encounters:  09/29/17 112/70  07/05/17 130/80  05/03/17 120/80   Hyperlipidemia -- Currently on Zocor 40 mg daily. Taking as directed. Also taking fenofibrate 160 mg and ASA 81 mg daily. Is trying to keep active and keep a low-fat diet.   Diabetes Mellitus II -- Previously above goal without complication. Patient is up-to-date on Foot and Eye examinations. Is taking Metformin and Jardiance as directed. Notes fasting sugars in the 110s. Denies polyuria, polydipsia or polyphagia.   Health Maintenance: Immunizations -- Declines flu shot. Colonoscopy -- up-to-date. 5-year recall.  Past Medical History:  Diagnosis Date  . Diabetes mellitus    2  . Diverticulosis   . GERD (gastroesophageal reflux disease)   . Gilbert syndrome   . Hyperlipidemia   . Hypertension   . Internal hemorrhoid   . Pancreatitis   . Pneumonia   . 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 ANTERIOR CERVICAL DECOMP/DISCECTOMY FUSION N/A 04/22/2017   Procedure: ANTERIOR CERVICAL DECOMPRESSION/DISCECTOMY FUSION 1 LEVEL  C6-7;  Surgeon: Eustace Moore, MD;  Location: [facility];  Service: Neurosurgery;  Laterality: N/A;  . CHOLECYSTECTOMY      Current Outpatient Medications on File Prior to Visit  Medication Sig Dispense Refill  . aspirin 81 MG tablet Take 81 mg by mouth every evening.     . fenofibrate 160 MG tablet Take 160 mg by mouth  once a day 90 tablet 0  . FREESTYLE LITE test strip Use on strip to test sugars 1-2 times daily. Dx. E11.9 50 each 12  . JARDIANCE 10 MG TABS tablet TAKE 1 TABLET BY MOUTH  DAILY 90 tablet 1  . Lancets (FREESTYLE) lancets 1 each by Other route 3 (three) times daily. PRN. E11.9 100 each 6  . lisinopril-hydrochlorothiazide (PRINZIDE,ZESTORETIC) 20-12.5 MG tablet TAKE 1 TABLET BY MOUTH  DAILY 90 tablet 1  . metFORMIN (GLUCOPHAGE) 1000 MG tablet TAKE 1 TABLET BY MOUTH  TWICE A DAY WITH MEALS 180 tablet 1  . Multiple Vitamins-Minerals (PRESERVISION AREDS 2) CAPS Take 1 capsule by mouth 2 (two) times daily.    Marland Kitchen omeprazole (PRILOSEC) 40 MG capsule Take 40 mg by mouth in the evening 90 capsule 1  . simvastatin (ZOCOR) 40 MG tablet Take 40 mg by mouth in the evening 90 tablet 1   No current facility-administered medications on file prior to visit.     Allergies  Allergen Reactions  . Penicillins Hives and Rash    Has patient had a PCN reaction causing immediate rash, facial/tongue/throat swelling, SOB or lightheadedness with hypotension: Yes Has patient had a PCN reaction causing severe rash involving mucus membranes or skin necrosis: No Has patient had a PCN reaction that required hospitalization: No Has patient had a PCN reaction occurring within the last 10 years: Yes If all of the above answers are "NO", then may proceed with Cephalosporin use.   . Fish Oil Nausea And Vomiting and Other (See Comments)  GI upset also   . Eggs Or Egg-Derived Products Nausea And Vomiting    Family History  Problem Relation Age of Onset  . Heart disease Father   . Diabetes Paternal Aunt   . Breast cancer Sister        x 2  . Colon cancer Neg Hx     Social History   Socioeconomic History  . Marital status: Married    Spouse name: Not on file  . Number of children: 2  . Years of education: Not on file  . Highest education level: Not on file  Occupational History  . Occupation: truck Press photographer Needs  . Financial resource strain: Not on file  . Food insecurity:    Worry: Not on file    Inability: Not on file  . Transportation needs:    Medical: Not on file    Non-medical: Not on file  Tobacco Use  . Smoking status: Never Smoker  . Smokeless tobacco: Never Used  Substance and Sexual Activity  . Alcohol use: No  . Drug use: No  . Sexual activity: Not on file  Lifestyle  . Physical activity:    Days per week: Not on file    Minutes per session: Not on file  . Stress: Not on file  Relationships  . Social connections:    Talks on phone: Not on file    Gets together: Not on file    Attends religious service: Not on file    Active member of club or organization: Not on file    Attends meetings of clubs or organizations: Not on file    Relationship status: Not on file  . Intimate partner violence:    Fear of current or ex partner: Not on file    Emotionally abused: Not on file    Physically abused: Not on file    Forced sexual activity: Not on file  Other Topics Concern  . Not on file  Social History Narrative  . Not on file   Review of Systems  Constitutional: Negative for fever and weight loss.  HENT: Negative for ear discharge, ear pain, hearing loss and tinnitus.   Eyes: Negative for blurred vision, double vision, photophobia and pain.  Respiratory: Negative for cough and shortness of breath.   Cardiovascular: Negative for chest pain and palpitations.  Gastrointestinal: Negative for abdominal pain, blood in stool, constipation, diarrhea, heartburn, melena, nausea and vomiting.  Genitourinary: Negative for dysuria, flank pain, frequency, hematuria and urgency.  Musculoskeletal: Negative for falls.  Neurological: Negative for dizziness, loss of consciousness and headaches.  Endo/Heme/Allergies: Negative for environmental allergies.  Psychiatric/Behavioral: Negative for depression, hallucinations, substance abuse and suicidal ideas. The patient is not  nervous/anxious and does not have insomnia.    BP 112/70   Pulse 66   Temp 97.8 F (36.6 C) (Oral)   Resp 14   Ht 5\' 7"  (1.702 m)   Wt 185 lb (83.9 kg)   SpO2 98%   BMI 28.98 kg/m   Physical Exam  Constitutional: He is oriented to person, place, and time. He appears well-developed and well-nourished. No distress.  HENT:  Head: Normocephalic and atraumatic.  Right Ear: Tympanic membrane, external ear and ear canal normal.  Left Ear: Tympanic membrane, external ear and ear canal normal.  Nose: Nose normal.  Mouth/Throat: Oropharynx is clear and moist and mucous membranes are normal. No posterior oropharyngeal edema or posterior oropharyngeal erythema.  Eyes: Pupils are equal, round, and reactive  to light. Conjunctivae are normal.  Neck: Neck supple. No thyromegaly present.  Cardiovascular: Normal rate, regular rhythm, normal heart sounds and intact distal pulses.  Pulmonary/Chest: Effort normal and breath sounds normal. No stridor. No respiratory distress. He has no wheezes. He has no rales. He exhibits no tenderness.  Abdominal: Soft. Bowel sounds are normal. He exhibits no distension and no mass. There is no tenderness. There is no rebound and no guarding.  Lymphadenopathy:    He has no cervical adenopathy.  Neurological: He is alert and oriented to person, place, and time. No cranial nerve deficit.  Skin: Skin is warm and dry. No rash noted. He is not diaphoretic.     Psychiatric: He has a normal mood and affect.  Vitals reviewed.   Assessment/Plan: Visit for preventive health examination Depression screen negative. Health Maintenance reviewed. Declines flu shot. Tetanus up-to-date.  Preventive schedule discussed and handout given in AVS. Will obtain fasting labs today.   Actinic keratosis Referral back to [facility] for comprehensive skin assessment and treatment. Skin care/protection reviewed with patient.   DM w/o complication type II Up-to-date on  monitoring except for A1C. Will check today along with BMP. Will alter regimen according to results.   Hyperlipidemia associated with type 2 diabetes mellitus Taking statin. Is staying active with noted weight loss. He is to be congratulated on this. Will check fasting lipids and LFTs.    Leeanne Rio, PA-C

## 2017-09-29 NOTE — Assessment & Plan Note (Signed)
Up-to-date on monitoring except for A1C. Will check today along with BMP. Will alter regimen according to results.

## 2017-09-29 NOTE — Assessment & Plan Note (Signed)
Taking statin. Is staying active with noted weight loss. He is to be congratulated on this. Will check fasting lipids and LFTs.

## 2017-09-29 NOTE — Patient Instructions (Signed)
Please go to the lab for blood work.   Our office will call you with your results unless you have chosen to receive results via MyChart.  If your blood work is normal we will follow-up each year for physicals and as scheduled for chronic medical problems.  If anything is abnormal we will treat accordingly and get you in for a follow-up.  You will be contacted to schedule an appointment with Dr. Allyson Sabal.  Keep your Flonase on hand and use when you have to [place]. This will hopefully help combat the effects of barometric pressure on your eustachian tubes and prevent you from having the ongoing clogged feeling.    Preventive Care 40-64 Years, Male Preventive care refers to lifestyle choices and visits with your health care provider that can promote health and wellness. What does preventive care include?  A yearly physical exam. This is also called an annual well check.  Dental exams once or twice a year.  Routine eye exams. Ask your health care provider how often you should have your eyes checked.  Personal lifestyle choices, including: ? Daily care of your teeth and gums. ? Regular physical activity. ? Eating a healthy diet. ? Avoiding tobacco and drug use. ? Limiting alcohol use. ? Practicing safe sex. ? Taking low-dose aspirin every day starting at age 57. What happens during an annual well check? The services and screenings done by your health care provider during your annual well check will depend on your age, overall health, lifestyle risk factors, and family history of disease. Counseling Your health care provider may ask you questions about your:  Alcohol use.  Tobacco use.  Drug use.  Emotional well-being.  Home and relationship well-being.  Sexual activity.  Eating habits.  Work and work Statistician.  Screening You may have the following tests or measurements:  Height, weight, and BMI.  Blood pressure.  Lipid and cholesterol  levels. These may be checked every 5 years, or more frequently if you are over 23 years old.  Skin check.  Lung cancer screening. You may have this screening every year starting at age 38 if you have a 30-pack-year history of smoking and currently smoke or have quit within the past 15 years.  Fecal occult blood test (FOBT) of the stool. You may have this test every year starting at age 13.  Flexible sigmoidoscopy or colonoscopy. You may have a sigmoidoscopy every 5 years or a colonoscopy every 10 years starting at age 69.  Prostate cancer screening. Recommendations will vary depending on your family history and other risks.  Hepatitis C blood test.  Hepatitis B blood test.  Sexually transmitted disease (STD) testing.  Diabetes screening. This is done by checking your blood sugar (glucose) after you have not eaten for a while (fasting). You may have this done every 1-3 years.  Discuss your test results, treatment options, and if necessary, the need for more tests with your health care provider. Vaccines Your health care provider may recommend certain vaccines, such as:  Influenza vaccine. This is recommended every year.  Tetanus, diphtheria, and acellular pertussis (Tdap, Td) vaccine. You may need a Td booster every 10 years.  Varicella vaccine. You may need this if you have not been vaccinated.  Zoster vaccine. You may need this after age 26.  Measles, mumps, and rubella (MMR) vaccine. You may need at least one dose of MMR if you were born in 1957 or later. You may also need a second dose.  Pneumococcal 13-valent conjugate (PCV13) vaccine. You may need this if you have certain conditions and have not been vaccinated.  Pneumococcal polysaccharide (PPSV23) vaccine. You may need one or two doses if you smoke cigarettes or if you have certain conditions.  Meningococcal vaccine. You may need this if you have certain conditions.  Hepatitis A vaccine. You may need this if you have  certain conditions or if you travel or work in places where you may be exposed to hepatitis A.  Hepatitis B vaccine. You may need this if you have certain conditions or if you travel or work in places where you may be exposed to hepatitis B.  Haemophilus influenzae type b (Hib) vaccine. You may need this if you have certain risk factors.  Talk to your health care provider about which screenings and vaccines you need and how often you need them. This information is not intended to replace advice given to you by your health care provider. Make sure you discuss any questions you have with your health care provider. Document Released: 02/06/2015 Document Revised: 09/30/2015 Document Reviewed: 11/11/2014 Elsevier Interactive Patient Education  [employer].

## 2017-10-06 ENCOUNTER — Encounter: Payer: 59 | Admitting: Physician Assistant

## 2018-02-10 ENCOUNTER — Other Ambulatory Visit: Payer: Self-pay | Admitting: Family Medicine

## 2018-04-28 ENCOUNTER — Other Ambulatory Visit: Payer: Self-pay | Admitting: Family Medicine

## 2018-05-25 ENCOUNTER — Other Ambulatory Visit: Payer: Self-pay | Admitting: Family Medicine

## 2018-09-14 ENCOUNTER — Ambulatory Visit (INDEPENDENT_AMBULATORY_CARE_PROVIDER_SITE_OTHER): Payer: 59 | Admitting: Physician Assistant

## 2018-09-14 ENCOUNTER — Encounter: Payer: Self-pay | Admitting: Physician Assistant

## 2018-09-14 ENCOUNTER — Other Ambulatory Visit: Payer: Self-pay

## 2018-09-14 VITALS — BP 130/74 | HR 83 | Temp 98.5°F | Resp 16 | Ht 67.5 in | Wt 189.0 lb

## 2018-09-14 DIAGNOSIS — Z125 Encounter for screening for malignant neoplasm of prostate: Secondary | ICD-10-CM

## 2018-09-14 DIAGNOSIS — Z0000 Encounter for general adult medical examination without abnormal findings: Secondary | ICD-10-CM

## 2018-09-14 DIAGNOSIS — E119 Type 2 diabetes mellitus without complications: Secondary | ICD-10-CM | POA: Diagnosis not present

## 2018-09-14 LAB — LIPID PANEL
Cholesterol: 162 mg/dL (ref 0–200)
HDL: 38.1 mg/dL — ABNORMAL LOW (ref >39.00)
NonHDL: 124.11
Total CHOL/HDL Ratio: 4
Triglycerides: 252 mg/dL — ABNORMAL HIGH (ref 0.0–149.0)
VLDL: 50.4 mg/dL — ABNORMAL HIGH (ref 0.0–40.0)

## 2018-09-14 LAB — CBC WITH DIFFERENTIAL/PLATELET
Basophils Absolute: 0.1 10*3/uL (ref 0.0–0.1)
Basophils Relative: 0.9 % (ref 0.0–3.0)
Eosinophils Absolute: 0.2 10*3/uL (ref 0.0–0.7)
Eosinophils Relative: 2.9 % (ref 0.0–5.0)
HCT: 48.8 % (ref 39.0–52.0)
Hemoglobin: 16.6 g/dL (ref 13.0–17.0)
Lymphocytes Relative: 28.2 % (ref 12.0–46.0)
Lymphs Abs: 2 10*3/uL (ref 0.7–4.0)
MCHC: 34 g/dL (ref 30.0–36.0)
MCV: 94.1 fl (ref 78.0–100.0)
Monocytes Absolute: 0.7 10*3/uL (ref 0.1–1.0)
Monocytes Relative: 10 % (ref 3.0–12.0)
Neutro Abs: 4.2 10*3/uL (ref 1.4–7.7)
Neutrophils Relative %: 58 % (ref 43.0–77.0)
Platelets: 267 10*3/uL (ref 150.0–400.0)
RBC: 5.19 Mil/uL (ref 4.22–5.81)
RDW: 12.7 % (ref 11.5–15.5)
WBC: 7.2 10*3/uL (ref 4.0–10.5)

## 2018-09-14 LAB — COMPREHENSIVE METABOLIC PANEL
ALT: 25 U/L (ref 0–53)
AST: 20 U/L (ref 0–37)
Albumin: 4.7 g/dL (ref 3.5–5.2)
Alkaline Phosphatase: 23 U/L — ABNORMAL LOW (ref 39–117)
BUN: 32 mg/dL — ABNORMAL HIGH (ref 6–23)
CO2: 25 mEq/L (ref 19–32)
Calcium: 9.6 mg/dL (ref 8.4–10.5)
Chloride: 102 mEq/L (ref 96–112)
Creatinine, Ser: 1.24 mg/dL (ref 0.40–1.50)
GFR: 59.1 mL/min — ABNORMAL LOW (ref >60.00)
Glucose, Bld: 116 mg/dL — ABNORMAL HIGH (ref 70–99)
Potassium: 4.5 mEq/L (ref 3.5–5.1)
Sodium: 136 mEq/L (ref 135–145)
Total Bilirubin: 0.9 mg/dL (ref 0.2–1.2)
Total Protein: 7 g/dL (ref 6.0–8.3)

## 2018-09-14 LAB — PSA: PSA: 0.37 ng/mL (ref 0.10–4.00)

## 2018-09-14 LAB — LDL CHOLESTEROL, DIRECT: Direct LDL: 89 mg/dL

## 2018-09-14 LAB — HEMOGLOBIN A1C: Hgb A1c MFr Bld: 7.3 % — ABNORMAL HIGH (ref 4.6–6.5)

## 2018-09-14 NOTE — Progress Notes (Signed)
Patient presents to clinic today for annual exam.  Patient is fasting for labs.  Acute Concerns: Denies acute concerns.   Chronic Issues: Diabetes Mellitus II -- controlled without known complication. Is currently on a regimen of Jardiance 10 mg QD, Metformin 1000 mg BID. Endorses taking as directed. Fasting glucose averaging 100-130. Has upcoming eye examination scheduled from 10/2018 (Dr. Herbert Deaner). Denies polyuria, polydipsia, polyphagia, numbness/tingling of hands or feet, vision changes. Pneumococcal immunizations are up-to-date. Declines flu shot. Due for foot exam. Denies concerns today.   HLD associated wiuth DM II -- Patient is currently on a combination of Simvastatin 40 mg QD and Fenofibrate 160 mg QD. Endorses taking as directed. 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 Does exercise when able.   Hypertension -- Currently on lisonopril-HCTZ 20-12.5 mg QD. Takes as directed. Patient denies chest pain, palpitations, lightheadedness, dizziness, vision changes or frequent headaches.  BP Readings from Last 3 Encounters:  09/14/18 130/74  09/29/17 112/70  07/05/17 130/80   Health Maintenance: Immunizations -- Declines flu shot today. Colonoscopy -- UTD  Past Medical History:  Diagnosis Date  . Diabetes mellitus    2  . Diverticulosis   . GERD (gastroesophageal reflux disease)   . Gilbert syndrome   . Hyperlipidemia   . Hypertension   . Internal hemorrhoid   . Pancreatitis   . Pneumonia   . 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 ANTERIOR CERVICAL DECOMP/DISCECTOMY FUSION N/A 04/22/2017   Procedure: ANTERIOR CERVICAL DECOMPRESSION/DISCECTOMY FUSION 1 LEVEL  C6-7;  Surgeon: Eustace Moore, MD;  Location: [facility];  Service: Neurosurgery;  Laterality: N/A;  . CHOLECYSTECTOMY      Current Outpatient Medications on File Prior to Visit  Medication Sig Dispense Refill  . aspirin  81 MG tablet Take 81 mg by mouth every evening.     . fenofibrate 160 MG tablet TAKE 1 TABLET BY MOUTH ONCE DAILY 90 tablet 0  . JARDIANCE 10 MG TABS tablet TAKE 1 TABLET BY MOUTH  DAILY 90 tablet 1  . Lancets (FREESTYLE) lancets 1 each by Other route 3 (three) times daily. PRN. E11.9 100 each 6  . lisinopril-hydrochlorothiazide (PRINZIDE,ZESTORETIC) 20-12.5 MG tablet TAKE 1 TABLET BY MOUTH  DAILY 90 tablet 1  . metFORMIN (GLUCOPHAGE) 1000 MG tablet TAKE 1 TABLET BY MOUTH  TWICE A DAY WITH MEALS 180 tablet 1  . Multiple Vitamins-Minerals (PRESERVISION AREDS 2) CAPS Take 1 capsule by mouth 2 (two) times daily.    Marland Kitchen omeprazole (PRILOSEC) 40 MG capsule TAKE 1 CAPSULE BY MOUTH IN  THE EVENING 90 capsule 1  . simvastatin (ZOCOR) 40 MG tablet TAKE 1 TABLET BY MOUTH IN  THE EVENING 90 tablet 1  . FREESTYLE LITE test strip Use on strip to test sugars 1-2 times daily. Dx. E11.9 50 each 12   No current facility-administered medications on file prior to visit.     Allergies  Allergen Reactions  . Penicillins Hives and Rash    Has patient had a PCN reaction causing immediate rash, facial/tongue/throat swelling, SOB or lightheadedness with hypotension: Yes Has patient had a PCN reaction causing severe rash involving mucus membranes or skin necrosis: No Has patient had a PCN reaction that required hospitalization: No Has patient had a PCN reaction occurring within the last 10 years: Yes If all of the above answers are "NO", then may proceed with Cephalosporin use.   Marland Kitchen  Fish Oil Nausea And Vomiting and Other (See Comments)    GI upset also   . Eggs Or Egg-Derived Products Nausea And Vomiting    Family History  Problem Relation Age of Onset  . Heart disease Father   . Diabetes Paternal Aunt   . Breast cancer Sister        x 2  . Colon cancer Neg Hx     Social History   Socioeconomic History  . Marital status: Married    Spouse name: Not on file  . Number of children: 2  . Years of  education: Not on file  . Highest education level: Not on file  Occupational History  . Occupation: truck Animator Needs  . Financial resource strain: Not on file  . Food insecurity    Worry: Not on file    Inability: Not on file  . Transportation needs    Medical: Not on file    Non-medical: Not on file  Tobacco Use  . Smoking status: Never Smoker  . Smokeless tobacco: Never Used  Substance and Sexual Activity  . Alcohol use: No  . Drug use: No  . Sexual activity: Not on file  Lifestyle  . Physical activity    Days per week: Not on file    Minutes per session: Not on file  . Stress: Not on file  Relationships  . Social Herbalist on phone: Not on file    Gets together: Not on file    Attends religious service: Not on file    Active member of club or organization: Not on file    Attends meetings of clubs or organizations: Not on file    Relationship status: Not on file  . Intimate partner violence    Fear of current or ex partner: Not on file    Emotionally abused: Not on file    Physically abused: Not on file    Forced sexual activity: Not on file  Other Topics Concern  . Not on file  Social History Narrative  . Not on file   Review of Systems  Constitutional: Negative for fever and weight loss.  HENT: Negative for ear discharge, ear pain, hearing loss and tinnitus.   Eyes: Negative for blurred vision, double vision, photophobia and pain.  Respiratory: Negative for cough and shortness of breath.   Cardiovascular: Negative for chest pain and palpitations.  Gastrointestinal: Negative for abdominal pain, blood in stool, constipation, diarrhea, heartburn, melena, nausea and vomiting.  Genitourinary: Negative for dysuria, flank pain, frequency, hematuria and urgency.  Musculoskeletal: Negative for falls.  Neurological: Negative for dizziness, loss of consciousness and headaches.  Endo/Heme/Allergies: Negative for environmental allergies.   Psychiatric/Behavioral: Negative for depression, hallucinations, substance abuse and suicidal ideas. The patient is not nervous/anxious and does not have insomnia.    BP 130/74   Pulse 83   Temp 98.5 F (36.9 C) (Skin)   Resp 16   Ht 5' 7.5" (1.715 m)   Wt 189 lb (85.7 kg)   SpO2 98%   BMI 29.16 kg/m   Physical Exam Vitals signs reviewed.  Constitutional:      General: He is not in acute distress.    Appearance: He is well-developed. He is not diaphoretic.  HENT:     Head: Normocephalic and atraumatic.     Right Ear: Tympanic membrane, ear canal and external ear normal.     Left Ear: Tympanic membrane, ear canal and external ear normal.  Nose: Nose normal.     Mouth/Throat:     Pharynx: No posterior oropharyngeal erythema.  Eyes:     Conjunctiva/sclera: Conjunctivae normal.     Pupils: Pupils are equal, round, and reactive to light.  Neck:     Musculoskeletal: Neck supple.     Thyroid: No thyromegaly.  Cardiovascular:     Rate and Rhythm: Normal rate and regular rhythm.     Heart sounds: Normal heart sounds.  Pulmonary:     Effort: Pulmonary effort is normal. No respiratory distress.     Breath sounds: Normal breath sounds. No wheezing or rales.  Chest:     Chest wall: No tenderness.  Abdominal:     General: Bowel sounds are normal. There is no distension.     Palpations: Abdomen is soft. There is no mass.     Tenderness: There is no abdominal tenderness. There is no guarding or rebound.  Lymphadenopathy:     Cervical: No cervical adenopathy.  Skin:    General: Skin is warm and dry.     Findings: No rash.  Neurological:     Mental Status: He is alert and oriented to person, place, and time.     Cranial Nerves: No cranial nerve deficit.    Diabetic Foot Form - Detailed   Diabetic Foot Exam - detailed Diabetic Foot exam was performed with the following findings: Yes 09/14/2018 10:29 AM  Visual Foot Exam completed.: Yes  Can the patient see the bottom of their  feet?: Yes Are the shoes appropriate in style and fit?: Yes Is there swelling or and abnormal foot shape?: No Is there a claw toe deformity?: No Is there elevated skin temparature?: No Is there foot or ankle muscle weakness?: No Normal Range of Motion: Yes Pulse Foot Exam completed.: Yes  Right posterior Tibialias: Present Left posterior Tibialias: Present  Right Dorsalis Pedis: Present Left Dorsalis Pedis: Present  Sensory Foot Exam Completed.: Yes Semmes-Weinstein Monofilament Test R Site 1-Great Toe: Pos L Site 1-Great Toe: Pos       Assessment/Plan: 1. Visit for preventive health examination Depression screen negative. Health Maintenance reviewed -- Declines flu shot. Other immunizations UTD. Colonoscopy UTD. Preventive schedule discussed and handout given in AVS. Will obtain fasting labs today.  - CBC with Differential/Platelet - Basic metabolic panel - Comprehensive metabolic panel - Hemoglobin A1c - Lipid panel  2. Prostate cancer screening The natural history of prostate cancer and ongoing controversy regarding screening and potential treatment outcomes of prostate cancer has been discussed with the patient. The meaning of a false positive PSA and a false negative PSA has been discussed. He indicates understanding of the limitations of this screening test and wishes to proceed with screening PSA testing.  - PSA  3. Type 2 diabetes mellitus without complication, without long-term current use of insulin (HCC) Controlled previously with last A1C at 6.9. No complications. Foot exam updated today without abnormal findings. Has upcoming eye exam. Again declines influenza immunization today. Pneumococcal vaccines UTD. Will repeat labs today. Is on ACEI therapy. Will continue current medication regimen for now. Will later based on lab results.    Leeanne Rio, PA-C

## 2018-09-14 NOTE — Patient Instructions (Signed)
Please go to the lab for blood work.   Our office will call you with your results unless you have chosen to receive results via MyChart.  If your blood work is normal we will follow-up each year for physicals and as scheduled for chronic medical problems.  If anything is abnormal we will treat accordingly and get you in for a follow-up.  Please consider cutting back on the Prilosec to every other day dosing for a few weeks. If symptoms are still well controlled would continue instead of daily dosing.    Preventive Care 21-42 Years Old, Male Preventive care refers to lifestyle choices and visits with your health care provider that can promote health and wellness. This includes:  A yearly physical exam. This is also called an annual well check.  Regular dental and eye exams.  Immunizations.  Screening for certain conditions.  Healthy lifestyle choices, such as eating a healthy diet, getting regular exercise, not using drugs or products that contain nicotine and tobacco, and limiting alcohol use. What can I expect for my preventive care visit? Physical exam Your health care provider will check:  Height and weight. These may be used to calculate body mass index (BMI), which is a measurement that tells if you are at a healthy weight.  Heart rate and blood pressure.  Your skin for abnormal spots. Counseling Your health care provider may ask you questions about:  Alcohol, tobacco, and drug use.  Emotional well-being.  Home and relationship well-being.  Sexual activity.  Eating habits.  Work and work Statistician. What immunizations do I need?  Influenza (flu) vaccine  This is recommended every year. Tetanus, diphtheria, and pertussis (Tdap) vaccine  You may need a Td booster every 10 years. Varicella (chickenpox) vaccine  You may need this vaccine if you have not already been vaccinated. Zoster (shingles) vaccine  You may need this after age 27. Measles, mumps,  and rubella (MMR) vaccine  You may need at least one dose of MMR if you were born in 1957 or later. You may also need a second dose. Pneumococcal conjugate (PCV13) vaccine  You may need this if you have certain conditions and were not previously vaccinated. Pneumococcal polysaccharide (PPSV23) vaccine  You may need one or two doses if you smoke cigarettes or if you have certain conditions. Meningococcal conjugate (MenACWY) vaccine  You may need this if you have certain conditions. Hepatitis A vaccine  You may need this if you have certain conditions or if you travel or work in places where you may be exposed to hepatitis A. Hepatitis B vaccine  You may need this if you have certain conditions or if you travel or work in places where you may be exposed to hepatitis B. Haemophilus influenzae type b (Hib) vaccine  You may need this if you have certain risk factors. Human papillomavirus (HPV) vaccine  If recommended by your health care provider, you may need three doses over 6 months. You may receive vaccines as individual doses or as more than one vaccine together in one shot (combination vaccines). Talk with your health care provider about the risks and benefits of combination vaccines. What tests do I need? Blood tests  Lipid and cholesterol levels. These may be checked every 5 years, or more frequently if you are over 60 years old.  Hepatitis C test.  Hepatitis B test. Screening  Lung cancer screening. You may have this screening every year starting at age 46 if you have a 30-pack-year history of  smoking and currently smoke or have quit within the past 15 years.  Prostate cancer screening. Recommendations will vary depending on your family history and other risks.  Colorectal cancer screening. All adults should have this screening starting at age 52 and continuing until age 1. Your health care provider may recommend screening at age 52 if you are at increased risk. You will  have tests every 1-10 years, depending on your results and the type of screening test.  Diabetes screening. This is done by checking your blood sugar (glucose) after you have not eaten for a while (fasting). You may have this done every 1-3 years.  Sexually transmitted disease (STD) testing. Follow these instructions at home: Eating and drinking  Eat a diet that includes fresh fruits and vegetables, whole grains, lean protein, and low-fat dairy products.  Take vitamin and mineral supplements as recommended by your health care provider.  Do not drink alcohol if your health care provider tells you not to drink.  If you drink alcohol: ? Limit how much you have to 0-2 drinks a day. ? Be aware of how much alcohol is in your drink. In the [place] one drink equals one 12 oz bottle of beer (355 mL), one 5 oz glass of wine (148 mL), or one 1 oz glass of hard liquor (44 mL). Lifestyle  Take daily care of your teeth and gums.  Stay active. Exercise for at least 30 minutes on 5 or more days each week.  Do not use any products that contain nicotine or tobacco, such as cigarettes, e-cigarettes, and chewing tobacco. If you need help quitting, ask your health care provider.  If you are sexually active, practice safe sex. Use a condom or other form of protection to prevent STIs (sexually transmitted infections).  Talk with your health care provider about taking a low-dose aspirin every day starting at age 31. What's next?  Go to your health care provider once a year for a well check visit.  Ask your health care provider how often you should have your eyes and teeth checked.  Stay up to date on all vaccines. This information is not intended to replace advice given to you by your health care provider. Make sure you discuss any questions you have with your health care provider. Document Released: 02/06/2015 Document Revised: 01/04/2018 Document Reviewed: 01/04/2018 Elsevier Patient Education  2020  [employer].

## 2018-09-17 ENCOUNTER — Encounter: Payer: 59 | Admitting: Physician Assistant

## 2018-09-18 ENCOUNTER — Encounter: Payer: 59 | Admitting: Physician Assistant

## 2018-10-04 ENCOUNTER — Other Ambulatory Visit: Payer: Self-pay | Admitting: Neurological Surgery

## 2018-10-04 DIAGNOSIS — M542 Cervicalgia: Secondary | ICD-10-CM

## 2018-10-07 ENCOUNTER — Other Ambulatory Visit: Payer: Self-pay | Admitting: Family Medicine

## 2018-10-22 ENCOUNTER — Other Ambulatory Visit: Payer: 59

## 2018-10-22 ENCOUNTER — Ambulatory Visit
Admission: RE | Admit: 2018-10-22 | Discharge: 2018-10-22 | Disposition: A | Discharge status: Home / Self Care | Source: Ambulatory Visit | Attending: Neurological Surgery | Admitting: Neurological Surgery

## 2018-10-22 DIAGNOSIS — M542 Cervicalgia: Secondary | ICD-10-CM

## 2019-01-10 ENCOUNTER — Encounter: Payer: Self-pay | Admitting: Family Medicine

## 2019-07-14 ENCOUNTER — Other Ambulatory Visit: Payer: Self-pay | Admitting: Family Medicine

## 2019-08-22 ENCOUNTER — Ambulatory Visit: Payer: 59 | Admitting: Family Medicine

## 2019-09-19 ENCOUNTER — Telehealth: Payer: Self-pay | Admitting: Family Medicine

## 2019-09-19 MED ORDER — METFORMIN HCL 1000 MG PO TABS
1000.0000 mg | ORAL_TABLET | Freq: Two times a day (BID) | ORAL | 0 refills | Status: DC
Start: 2019-09-19 — End: 2019-10-10

## 2019-09-19 NOTE — Telephone Encounter (Signed)
Please advise 

## 2019-09-19 NOTE — Telephone Encounter (Signed)
Medication filled to pharmacy as requested.   

## 2019-09-19 NOTE — Telephone Encounter (Signed)
Pt's wife called in stating that pt is out of his Metformin, she states that he does have an appt with Dr. Birdie Riddle tomorrow but as soon as he is done he has to get back on the road. He is an [employer] Administrator. She wanted to know if a 30 day supply could be sent into [facility].   Please advise  After his appt she would like all his meds sent into [facility] mail [facility]

## 2019-09-19 NOTE — Telephone Encounter (Signed)
Ok for 30 day supply and then we can fill the rest after appt

## 2019-09-20 ENCOUNTER — Other Ambulatory Visit: Payer: Self-pay

## 2019-09-20 ENCOUNTER — Ambulatory Visit: Payer: 59 | Admitting: Family Medicine

## 2019-09-20 ENCOUNTER — Encounter: Payer: Self-pay | Admitting: Family Medicine

## 2019-09-20 VITALS — BP 123/71 | HR 81 | Temp 97.9°F | Resp 16 | Ht 68.0 in | Wt 178.4 lb

## 2019-09-20 DIAGNOSIS — E785 Hyperlipidemia, unspecified: Secondary | ICD-10-CM

## 2019-09-20 DIAGNOSIS — E663 Overweight: Secondary | ICD-10-CM | POA: Diagnosis not present

## 2019-09-20 DIAGNOSIS — I10 Essential (primary) hypertension: Secondary | ICD-10-CM | POA: Diagnosis not present

## 2019-09-20 DIAGNOSIS — R519 Headache, unspecified: Secondary | ICD-10-CM

## 2019-09-20 DIAGNOSIS — E119 Type 2 diabetes mellitus without complications: Secondary | ICD-10-CM

## 2019-09-20 DIAGNOSIS — L255 Unspecified contact dermatitis due to plants, except food: Secondary | ICD-10-CM

## 2019-09-20 DIAGNOSIS — E1169 Type 2 diabetes mellitus with other specified complication: Secondary | ICD-10-CM | POA: Diagnosis not present

## 2019-09-20 LAB — CBC WITH DIFFERENTIAL/PLATELET
Basophils Absolute: 0.1 10*3/uL (ref 0.0–0.1)
Basophils Relative: 0.7 % (ref 0.0–3.0)
Eosinophils Absolute: 0.1 10*3/uL (ref 0.0–0.7)
Eosinophils Relative: 1.5 % (ref 0.0–5.0)
HCT: 50.6 % (ref 39.0–52.0)
Hemoglobin: 17.1 g/dL — ABNORMAL HIGH (ref 13.0–17.0)
Lymphocytes Relative: 29.4 % (ref 12.0–46.0)
Lymphs Abs: 2.2 10*3/uL (ref 0.7–4.0)
MCHC: 33.7 g/dL (ref 30.0–36.0)
MCV: 94.3 fl (ref 78.0–100.0)
Monocytes Absolute: 0.9 10*3/uL (ref 0.1–1.0)
Monocytes Relative: 12.7 % — ABNORMAL HIGH (ref 3.0–12.0)
Neutro Abs: 4.1 10*3/uL (ref 1.4–7.7)
Neutrophils Relative %: 55.7 % (ref 43.0–77.0)
Platelets: 258 10*3/uL (ref 150.0–400.0)
RBC: 5.37 Mil/uL (ref 4.22–5.81)
RDW: 13 % (ref 11.5–15.5)
WBC: 7.4 10*3/uL (ref 4.0–10.5)

## 2019-09-20 LAB — LIPID PANEL
Cholesterol: 135 mg/dL (ref 0–200)
HDL: 41.5 mg/dL (ref >39.00)
LDL Cholesterol: 58 mg/dL (ref 0–99)
NonHDL: 93.09
Total CHOL/HDL Ratio: 3
Triglycerides: 176 mg/dL — ABNORMAL HIGH (ref 0.0–149.0)
VLDL: 35.2 mg/dL (ref 0.0–40.0)

## 2019-09-20 LAB — BASIC METABOLIC PANEL
BUN: 26 mg/dL — ABNORMAL HIGH (ref 6–23)
CO2: 25 mEq/L (ref 19–32)
Calcium: 9.8 mg/dL (ref 8.4–10.5)
Chloride: 101 mEq/L (ref 96–112)
Creatinine, Ser: 1.29 mg/dL (ref 0.40–1.50)
GFR: 56.27 mL/min — ABNORMAL LOW (ref >60.00)
Glucose, Bld: 128 mg/dL — ABNORMAL HIGH (ref 70–99)
Potassium: 4.9 mEq/L (ref 3.5–5.1)
Sodium: 136 mEq/L (ref 135–145)

## 2019-09-20 LAB — HEPATIC FUNCTION PANEL
ALT: 20 U/L (ref 0–53)
AST: 17 U/L (ref 0–37)
Albumin: 4.6 g/dL (ref 3.5–5.2)
Alkaline Phosphatase: 24 U/L — ABNORMAL LOW (ref 39–117)
Bilirubin, Direct: 0.2 mg/dL (ref 0.0–0.3)
Total Bilirubin: 1 mg/dL (ref 0.2–1.2)
Total Protein: 7 g/dL (ref 6.0–8.3)

## 2019-09-20 LAB — TSH: TSH: 1.75 u[IU]/mL (ref 0.35–4.50)

## 2019-09-20 LAB — HEMOGLOBIN A1C: Hgb A1c MFr Bld: 6.9 % — ABNORMAL HIGH (ref 4.6–6.5)

## 2019-09-20 MED ORDER — TRIAMCINOLONE ACETONIDE 0.1 % EX OINT: 1.0000 "application " | TOPICAL_OINTMENT | Freq: Two times a day (BID) | CUTANEOUS | 1 refills | Status: AC

## 2019-09-20 MED ORDER — SERTRALINE HCL 25 MG PO TABS: 25.0000 mg | ORAL_TABLET | Freq: Every day | ORAL | 3 refills | Status: DC

## 2019-09-20 NOTE — Assessment & Plan Note (Signed)
New.  Pt reports this is from his previous neck injury.  Says when this happened before he was placed on Zoloft and sxs almost immediately improved.  Will restart medication and monitor.

## 2019-09-20 NOTE — Assessment & Plan Note (Signed)
Chronic problem.  On Jardiance and Metformin w/ hx of good control.  On ACE for renal protection.  Foot exam done today.  Due for eye exam- pt to schedule.  Check labs.  Adjust meds prn

## 2019-09-20 NOTE — Assessment & Plan Note (Signed)
Chronic problem.  Well controlled.  Currently asymptomatic.  Check labs.  No anticipated med changes.  Will follow. 

## 2019-09-20 NOTE — Assessment & Plan Note (Signed)
Chronic problem.  Tolerating statin and fenofibrate w/o difficulty.  Check labs.  Adjust meds prn  

## 2019-09-20 NOTE — Progress Notes (Signed)
   Subjective:    Patient ID: Pedro [name], male    DOB: 03-21-1955, 64 y.o.   MRN: [id number]  HPI DM- chronic problem, on Jardiance 10mg  [name], Metformin 1000mg  BID w/ hx of good control.  On ACE for renal protection.  Due for eye exam, foot exam.  No numbness/tingling of hands/feet.  No sores on feet.  Denies symptomatic lows.  Hyperlipidemia- chronic problem, on Fenofibrate 160mg  [name], Simvastatin 40mg  [name].  Denies abd pain, N/V.  HTN- chronic problem, on Lisinopril HCTZ 20/12.5mg  [name] w/ good control.  No CP, SOB, visual changes, edema.  + [name] HA from previous neck injury- reports HAs previously resolved w/ Zoloft  Overweight- pt is down 11 lbs since last visit.  Pt reports feeling good.  Eating more balanced diet.  Walking regularly   [facility]- pt reports 10 days of sxs.  On L leg, bilateral flanks   Review of Systems For ROS see HPI   This visit occurred during the SARS-CoV-2 public health emergency.  Safety protocols were in place, including screening questions prior to the visit, additional usage of staff PPE, and extensive cleaning of exam room while observing appropriate contact time as indicated for disinfecting solutions.       Objective:   Physical Exam Vitals reviewed.  Constitutional:      General: He is not in acute distress.    Appearance: Normal appearance. He is well-developed.  HENT:     Head: Normocephalic and atraumatic.  Eyes:     Conjunctiva/sclera: Conjunctivae normal.     Pupils: Pupils are equal, round, and reactive to light.  Neck:     Thyroid: No thyromegaly.  Cardiovascular:     Rate and Rhythm: Normal rate and regular rhythm.     Heart sounds: Normal heart sounds. No murmur heard.   Pulmonary:     Effort: Pulmonary effort is normal. No respiratory distress.     Breath sounds: Normal breath sounds.  Abdominal:     General: Bowel sounds are normal. There is no distension.     Palpations: Abdomen is soft.  Musculoskeletal:      Cervical back: Normal range of motion and neck supple.  Lymphadenopathy:     Cervical: No cervical adenopathy.  Skin:    General: Skin is warm and dry.     Findings: Rash (plant dermatitis on L lower leg) present.  Neurological:     Mental Status: He is alert and oriented to person, place, and time.     Cranial Nerves: No cranial nerve deficit.  Psychiatric:        Behavior: Behavior normal.           Assessment & Plan:

## 2019-09-20 NOTE — Assessment & Plan Note (Signed)
Pt is down 11 lbs since last visit.  Applauded his efforts at healthy diet.  Encouraged him to continue.  Will follow.

## 2019-09-20 NOTE — Patient Instructions (Signed)
Schedule your complete physical in 6 months We'll notify you of your lab results and make any changes if needed Continue to work on healthy diet and regular exercise- you're doing great! Schedule your eye exam and have them send me the report Get your COVID shot ASAP Use the Triamcinolone ointment on the poison twice daily START the Sertraline daily and let me know if it helps the headaches Call with any questions or concerns Stay Safe!  Stay Healthy!

## 2019-09-23 ENCOUNTER — Encounter: Payer: Self-pay | Admitting: General Practice

## 2019-10-09 ENCOUNTER — Encounter: Payer: Self-pay | Admitting: Family Medicine

## 2019-10-10 ENCOUNTER — Other Ambulatory Visit: Payer: Self-pay | Admitting: General Practice

## 2019-10-10 MED ORDER — OMEPRAZOLE 40 MG PO CPDR: 40.0000 mg | DELAYED_RELEASE_CAPSULE | Freq: Every day | ORAL | 1 refills | Status: AC

## 2019-10-10 MED ORDER — EMPAGLIFLOZIN 10 MG PO TABS: 10.0000 mg | ORAL_TABLET | Freq: Every day | ORAL | 1 refills | Status: AC

## 2019-10-10 MED ORDER — LISINOPRIL-HYDROCHLOROTHIAZIDE 20-12.5 MG PO TABS: 1.0000 | ORAL_TABLET | Freq: Every day | ORAL | 1 refills | Status: AC

## 2019-10-10 MED ORDER — SERTRALINE HCL 25 MG PO TABS: 25.0000 mg | ORAL_TABLET | Freq: Every day | ORAL | 0 refills | Status: AC

## 2019-10-10 MED ORDER — METFORMIN HCL 1000 MG PO TABS: 1000.0000 mg | ORAL_TABLET | Freq: Two times a day (BID) | ORAL | 1 refills | Status: AC

## 2019-10-10 MED ORDER — METFORMIN HCL 1000 MG PO TABS
1000.0000 mg | ORAL_TABLET | Freq: Two times a day (BID) | ORAL | 1 refills | Status: DC
Start: 2019-10-10 — End: 2019-10-10

## 2019-10-10 MED ORDER — SIMVASTATIN 40 MG PO TABS: 40.0000 mg | ORAL_TABLET | Freq: Every evening | ORAL | 1 refills | Status: AC

## 2019-10-10 MED ORDER — FENOFIBRATE 160 MG PO TABS: 160.0000 mg | ORAL_TABLET | Freq: Every day | ORAL | 1 refills | Status: AC

## 2019-10-21 ENCOUNTER — Inpatient Hospital Stay (HOSPITAL_COMMUNITY)
Admission: EM | Admit: 2019-10-21 | Discharge: 2019-10-25 | DRG: 208 | Disposition: E | Payer: 59 | Attending: Internal Medicine | Admitting: Internal Medicine

## 2019-10-21 ENCOUNTER — Encounter (HOSPITAL_COMMUNITY): Payer: Self-pay | Admitting: Emergency Medicine

## 2019-10-21 ENCOUNTER — Other Ambulatory Visit: Payer: Self-pay

## 2019-10-21 ENCOUNTER — Emergency Department (HOSPITAL_COMMUNITY): Payer: 59

## 2019-10-21 DIAGNOSIS — Z880 Allergy status to penicillin: Secondary | ICD-10-CM | POA: Diagnosis not present

## 2019-10-21 DIAGNOSIS — Z0189 Encounter for other specified special examinations: Secondary | ICD-10-CM

## 2019-10-21 DIAGNOSIS — F419 Anxiety disorder, unspecified: Secondary | ICD-10-CM | POA: Diagnosis present

## 2019-10-21 DIAGNOSIS — Z9049 Acquired absence of other specified parts of digestive tract: Secondary | ICD-10-CM

## 2019-10-21 DIAGNOSIS — R579 Shock, unspecified: Secondary | ICD-10-CM | POA: Diagnosis not present

## 2019-10-21 DIAGNOSIS — Z515 Encounter for palliative care: Secondary | ICD-10-CM | POA: Diagnosis not present

## 2019-10-21 DIAGNOSIS — I9581 Postprocedural hypotension: Secondary | ICD-10-CM | POA: Diagnosis not present

## 2019-10-21 DIAGNOSIS — E860 Dehydration: Secondary | ICD-10-CM | POA: Diagnosis present

## 2019-10-21 DIAGNOSIS — I10 Essential (primary) hypertension: Secondary | ICD-10-CM | POA: Diagnosis present

## 2019-10-21 DIAGNOSIS — Z66 Do not resuscitate: Secondary | ICD-10-CM | POA: Diagnosis not present

## 2019-10-21 DIAGNOSIS — Z7984 Long term (current) use of oral hypoglycemic drugs: Secondary | ICD-10-CM

## 2019-10-21 DIAGNOSIS — Z833 Family history of diabetes mellitus: Secondary | ICD-10-CM

## 2019-10-21 DIAGNOSIS — Z86010 Personal history of colonic polyps: Secondary | ICD-10-CM

## 2019-10-21 DIAGNOSIS — J1282 Pneumonia due to coronavirus disease 2019: Secondary | ICD-10-CM | POA: Diagnosis present

## 2019-10-21 DIAGNOSIS — Z8701 Personal history of pneumonia (recurrent): Secondary | ICD-10-CM | POA: Diagnosis not present

## 2019-10-21 DIAGNOSIS — U071 COVID-19: Principal | ICD-10-CM

## 2019-10-21 DIAGNOSIS — R0602 Shortness of breath: Secondary | ICD-10-CM | POA: Diagnosis present

## 2019-10-21 DIAGNOSIS — Z978 Presence of other specified devices: Secondary | ICD-10-CM

## 2019-10-21 DIAGNOSIS — Z8249 Family history of ischemic heart disease and other diseases of the circulatory system: Secondary | ICD-10-CM

## 2019-10-21 DIAGNOSIS — Z91012 Allergy to eggs: Secondary | ICD-10-CM | POA: Diagnosis not present

## 2019-10-21 DIAGNOSIS — N179 Acute kidney failure, unspecified: Secondary | ICD-10-CM | POA: Diagnosis present

## 2019-10-21 DIAGNOSIS — E785 Hyperlipidemia, unspecified: Secondary | ICD-10-CM | POA: Diagnosis present

## 2019-10-21 DIAGNOSIS — I4891 Unspecified atrial fibrillation: Secondary | ICD-10-CM | POA: Diagnosis not present

## 2019-10-21 DIAGNOSIS — J80 Acute respiratory distress syndrome: Secondary | ICD-10-CM | POA: Diagnosis present

## 2019-10-21 DIAGNOSIS — J9601 Acute respiratory failure with hypoxia: Secondary | ICD-10-CM | POA: Diagnosis not present

## 2019-10-21 DIAGNOSIS — F329 Major depressive disorder, single episode, unspecified: Secondary | ICD-10-CM | POA: Diagnosis present

## 2019-10-21 DIAGNOSIS — Z91018 Allergy to other foods: Secondary | ICD-10-CM

## 2019-10-21 DIAGNOSIS — Z7982 Long term (current) use of aspirin: Secondary | ICD-10-CM

## 2019-10-21 DIAGNOSIS — E119 Type 2 diabetes mellitus without complications: Secondary | ICD-10-CM | POA: Diagnosis present

## 2019-10-21 DIAGNOSIS — K219 Gastro-esophageal reflux disease without esophagitis: Secondary | ICD-10-CM | POA: Diagnosis present

## 2019-10-21 DIAGNOSIS — J9690 Respiratory failure, unspecified, unspecified whether with hypoxia or hypercapnia: Secondary | ICD-10-CM

## 2019-10-21 DIAGNOSIS — R0902 Hypoxemia: Secondary | ICD-10-CM

## 2019-10-21 DIAGNOSIS — R7989 Other specified abnormal findings of blood chemistry: Secondary | ICD-10-CM | POA: Diagnosis not present

## 2019-10-21 DIAGNOSIS — Z79899 Other long term (current) drug therapy: Secondary | ICD-10-CM

## 2019-10-21 LAB — COMPREHENSIVE METABOLIC PANEL
ALT: 37 U/L (ref 0–44)
AST: 76 U/L — ABNORMAL HIGH (ref 15–41)
Albumin: 2.9 g/dL — ABNORMAL LOW (ref 3.5–5.0)
Alkaline Phosphatase: 36 U/L — ABNORMAL LOW (ref 38–126)
Anion gap: 15 (ref 5–15)
BUN: 99 mg/dL — ABNORMAL HIGH (ref 8–23)
CO2: 19 mmol/L — ABNORMAL LOW (ref 22–32)
Calcium: 8.6 mg/dL — ABNORMAL LOW (ref 8.9–10.3)
Chloride: 101 mmol/L (ref 98–111)
Creatinine, Ser: 2.04 mg/dL — ABNORMAL HIGH (ref 0.61–1.24)
GFR calc Af Amer: 39 mL/min — ABNORMAL LOW (ref >60)
GFR calc non Af Amer: 34 mL/min — ABNORMAL LOW (ref >60)
Glucose, Bld: 179 mg/dL — ABNORMAL HIGH (ref 70–99)
Potassium: 4.5 mmol/L (ref 3.5–5.1)
Sodium: 135 mmol/L (ref 135–145)
Total Bilirubin: 1.4 mg/dL — ABNORMAL HIGH (ref 0.3–1.2)
Total Protein: 6.9 g/dL (ref 6.5–8.1)

## 2019-10-21 LAB — FIBRINOGEN: Fibrinogen: 633 mg/dL — ABNORMAL HIGH (ref 210–475)

## 2019-10-21 LAB — CBC
HCT: 49.2 % (ref 39.0–52.0)
Hemoglobin: 16.2 g/dL (ref 13.0–17.0)
MCH: 30.2 pg (ref 26.0–34.0)
MCHC: 32.9 g/dL (ref 30.0–36.0)
MCV: 91.8 fL (ref 80.0–100.0)
Platelets: 366 10*3/uL (ref 150–400)
RBC: 5.36 MIL/uL (ref 4.22–5.81)
RDW: 12.7 % (ref 11.5–15.5)
WBC: 9.9 10*3/uL (ref 4.0–10.5)
nRBC: 0 % (ref 0.0–0.2)

## 2019-10-21 LAB — PROCALCITONIN: Procalcitonin: 0.33 ng/mL

## 2019-10-21 LAB — BASIC METABOLIC PANEL WITH GFR
Anion gap: 14 (ref 5–15)
BUN: 102 mg/dL — ABNORMAL HIGH (ref 8–23)
CO2: 21 mmol/L — ABNORMAL LOW (ref 22–32)
Calcium: 8.6 mg/dL — ABNORMAL LOW (ref 8.9–10.3)
Chloride: 100 mmol/L (ref 98–111)
Creatinine, Ser: 2.01 mg/dL — ABNORMAL HIGH (ref 0.61–1.24)
GFR calc Af Amer: 40 mL/min — ABNORMAL LOW
GFR calc non Af Amer: 35 mL/min — ABNORMAL LOW
Glucose, Bld: 178 mg/dL — ABNORMAL HIGH (ref 70–99)
Potassium: 4.5 mmol/L (ref 3.5–5.1)
Sodium: 135 mmol/L (ref 135–145)

## 2019-10-21 LAB — C-REACTIVE PROTEIN: CRP: 21.1 mg/dL — ABNORMAL HIGH (ref <1.0)

## 2019-10-21 LAB — FERRITIN: Ferritin: 2488 ng/mL — ABNORMAL HIGH (ref 24–336)

## 2019-10-21 LAB — LACTATE DEHYDROGENASE: LDH: 756 U/L — ABNORMAL HIGH (ref 98–192)

## 2019-10-21 LAB — RESPIRATORY PANEL BY RT PCR (FLU A&B, COVID)
Influenza A by PCR: NEGATIVE
Influenza B by PCR: NEGATIVE
SARS Coronavirus 2 by RT PCR: POSITIVE — AB

## 2019-10-21 LAB — TRIGLYCERIDES: Triglycerides: 106 mg/dL

## 2019-10-21 LAB — LACTIC ACID, PLASMA: Lactic Acid, Venous: 1.7 mmol/L (ref 0.5–1.9)

## 2019-10-21 LAB — CBG MONITORING, ED: Glucose-Capillary: 139 mg/dL — ABNORMAL HIGH (ref 70–99)

## 2019-10-21 LAB — D-DIMER, QUANTITATIVE: D-Dimer, Quant: 2.43 ug/mL-FEU — ABNORMAL HIGH (ref 0.00–0.50)

## 2019-10-21 MED ORDER — BARICITINIB 2 MG PO TABS
2.0000 mg | ORAL_TABLET | Freq: Every day | ORAL | Status: DC
  Administered 2019-10-21 – 2019-10-22 (×2): 2 mg via ORAL
  Filled 2019-10-21 (×2): qty 1

## 2019-10-21 MED ORDER — SIMVASTATIN 20 MG PO TABS
40.0000 mg | ORAL_TABLET | Freq: Every evening | ORAL | Status: DC
  Administered 2019-10-21 – 2019-10-22 (×2): 40 mg via ORAL
  Filled 2019-10-21 (×2): qty 2

## 2019-10-21 MED ORDER — SODIUM CHLORIDE 0.9 % IV SOLN
200.0000 mg | Freq: Once | INTRAVENOUS | Status: AC
  Administered 2019-10-21: 200 mg via INTRAVENOUS
  Filled 2019-10-21: qty 40

## 2019-10-21 MED ORDER — FENOFIBRATE 160 MG PO TABS
160.0000 mg | ORAL_TABLET | Freq: Every day | ORAL | Status: DC
  Administered 2019-10-22: 160 mg via ORAL
  Filled 2019-10-21 (×3): qty 1

## 2019-10-21 MED ORDER — PANTOPRAZOLE SODIUM 40 MG PO TBEC
40.0000 mg | DELAYED_RELEASE_TABLET | Freq: Every day | ORAL | Status: DC
  Administered 2019-10-21 – 2019-10-22 (×2): 40 mg via ORAL
  Filled 2019-10-21 (×2): qty 1

## 2019-10-21 MED ORDER — SODIUM CHLORIDE 0.9 % IV BOLUS
1000.0000 mL | Freq: Once | INTRAVENOUS | Status: AC
  Administered 2019-10-21: 1000 mL via INTRAVENOUS

## 2019-10-21 MED ORDER — SERTRALINE HCL 25 MG PO TABS
25.0000 mg | ORAL_TABLET | Freq: Every day | ORAL | Status: DC
  Administered 2019-10-21 – 2019-10-22 (×2): 25 mg via ORAL
  Filled 2019-10-21 (×3): qty 1

## 2019-10-21 MED ORDER — INSULIN ASPART 100 UNIT/ML ~~LOC~~ SOLN
0.0000 [IU] | Freq: Three times a day (TID) | SUBCUTANEOUS | Status: DC
  Administered 2019-10-22: 1 [IU] via SUBCUTANEOUS
  Administered 2019-10-22 – 2019-10-23 (×3): 2 [IU] via SUBCUTANEOUS
  Administered 2019-10-23: 1 [IU] via SUBCUTANEOUS

## 2019-10-21 MED ORDER — TRIAMCINOLONE ACETONIDE 0.1 % EX OINT
1.0000 "application " | TOPICAL_OINTMENT | Freq: Two times a day (BID) | CUTANEOUS | Status: DC
  Administered 2019-10-23 (×2): 1 via TOPICAL
  Filled 2019-10-21 (×2): qty 15

## 2019-10-21 MED ORDER — METHYLPREDNISOLONE SODIUM SUCC 40 MG IJ SOLR
40.0000 mg | Freq: Two times a day (BID) | INTRAMUSCULAR | Status: DC
  Administered 2019-10-21 – 2019-10-22 (×2): 40 mg via INTRAVENOUS
  Filled 2019-10-21 (×2): qty 1

## 2019-10-21 MED ORDER — HEPARIN SODIUM (PORCINE) 5000 UNIT/ML IJ SOLN
5000.0000 [IU] | Freq: Two times a day (BID) | INTRAMUSCULAR | Status: DC
  Administered 2019-10-21 – 2019-10-23 (×4): 5000 [IU] via SUBCUTANEOUS
  Filled 2019-10-21 (×3): qty 1

## 2019-10-21 MED ORDER — IPRATROPIUM-ALBUTEROL 20-100 MCG/ACT IN AERS
1.0000 | INHALATION_SPRAY | Freq: Four times a day (QID) | RESPIRATORY_TRACT | Status: DC
  Administered 2019-10-22 (×4): 1 via RESPIRATORY_TRACT
  Filled 2019-10-21: qty 4

## 2019-10-21 MED ORDER — ASPIRIN EC 81 MG PO TBEC
81.0000 mg | DELAYED_RELEASE_TABLET | Freq: Every evening | ORAL | Status: DC
  Administered 2019-10-21 – 2019-10-22 (×2): 81 mg via ORAL
  Filled 2019-10-21 (×2): qty 1

## 2019-10-21 MED ORDER — ONDANSETRON HCL 4 MG PO TABS
4.0000 mg | ORAL_TABLET | Freq: Four times a day (QID) | ORAL | Status: DC | PRN
  Administered 2019-10-22: 4 mg via ORAL
  Filled 2019-10-21: qty 1

## 2019-10-21 MED ORDER — SODIUM CHLORIDE 0.9 % IV SOLN
1000.0000 mL | INTRAVENOUS | Status: DC
  Administered 2019-10-21 – 2019-10-23 (×5): 1000 mL via INTRAVENOUS

## 2019-10-21 MED ORDER — GUAIFENESIN ER 600 MG PO TB12
1200.0000 mg | ORAL_TABLET | Freq: Two times a day (BID) | ORAL | Status: DC
  Administered 2019-10-21 – 2019-10-22 (×4): 1200 mg via ORAL
  Filled 2019-10-21 (×4): qty 2

## 2019-10-21 MED ORDER — ACETAMINOPHEN 325 MG PO TABS
650.0000 mg | ORAL_TABLET | Freq: Four times a day (QID) | ORAL | Status: DC | PRN
  Administered 2019-10-22 – 2019-10-23 (×3): 650 mg via ORAL
  Filled 2019-10-21 (×3): qty 2

## 2019-10-21 MED ORDER — SODIUM CHLORIDE 0.9 % IV SOLN
100.0000 mg | Freq: Every day | INTRAVENOUS | Status: DC
  Administered 2019-10-22: 100 mg via INTRAVENOUS
  Filled 2019-10-21 (×2): qty 20

## 2019-10-21 MED ORDER — PREDNISONE 20 MG PO TABS: 50.0000 mg | ORAL_TABLET | Freq: Every day | ORAL | Status: DC

## 2019-10-21 MED ORDER — ONDANSETRON HCL 4 MG/2ML IJ SOLN: 4.0000 mg | Freq: Four times a day (QID) | INTRAMUSCULAR | Status: DC | PRN

## 2019-10-21 NOTE — Progress Notes (Signed)
PT on NRB

## 2019-10-21 NOTE — ED Triage Notes (Signed)
Pt here from home covid positive with c/o sob , sats in the 70's upon arrival , pt is unvaccinated day 7 of covid

## 2019-10-21 NOTE — ED Notes (Signed)
Pt off of BIPAP at this time. Pt currently on a NRB at 98%. Pt in NAD.

## 2019-10-21 NOTE — ED Provider Notes (Signed)
[facility] EMERGENCY DEPARTMENT Provider Note   CSN: [id number] Arrival date & time: 10/01/2019  2263     History No chief complaint on file.   Pedro [name] is a 64 y.o. male.  HPI 64 yo male ho gerd, hypertension, hyperlipidemia, t2dm, obesity presents today with covid with associated sob and low sats.  Patient is unvaccinated against covid.  He began having a cough about a week ago.  He has had chills, body aches, and poor appetite.  He has had worsening sob over the past week.  He has not taken his temperature and does not think he has a fever.  His wife is here with similar symptoms.  He has not had any outpatient treatment.       Past Medical History:  Diagnosis Date  . Diabetes mellitus    2  . Diverticulosis   . GERD (gastroesophageal reflux disease)   . Gilbert syndrome   . Hyperlipidemia   . Hypertension   . Internal hemorrhoid   . Pancreatitis   . Pneumonia   . 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 Overweight (BMI 25.0-29.9) 09/20/2019  . Daily headache 09/20/2019  . Actinic keratosis 09/29/2017  . Visit for preventive health examination 09/29/2017  . S/P cervical spinal fusion 04/22/2017  . C7 cervical fracture ([place]) 04/20/2017  . Physical exam 09/29/2016  . Sinusitis 01/24/2012  . DM w/o complication type II ([place]) 04/06/2010  . Hyperlipidemia associated with type 2 diabetes mellitus ([place]) 04/06/2010  . HYPERTENSION, BENIGN ESSENTIAL 04/06/2010  . NAUSEA 09/01/2008  . DIARRHEA 09/01/2008  . Flatulence, eructation, and gas pain 08/28/2008  . Abdominal pain, generalized 08/28/2008  . GERD 05/22/2008  . PERSONAL HX COLONIC POLYPS 05/22/2008    Past Surgical History:  Procedure Laterality Date  . ANTERIOR CERVICAL DECOMP/DISCECTOMY FUSION N/A 04/22/2017   Procedure: ANTERIOR CERVICAL DECOMPRESSION/DISCECTOMY FUSION 1 LEVEL  C6-7;  Surgeon: Eustace Moore, MD;  Location: [facility];  Service: Neurosurgery;  Laterality: N/A;  . CHOLECYSTECTOMY         Family History  Problem Relation Age of Onset  . Heart disease Father   . Diabetes Paternal Aunt   . Breast cancer Sister        x 2  . Colon cancer Neg Hx     Social History   Tobacco Use  . Smoking status: Never Smoker  . Smokeless tobacco: Never Used  Vaping Use  . Vaping Use: Never used  Substance Use Topics  . Alcohol use: No  . Drug use: No    Home Medications Prior to Admission medications   Medication Sig Start Date End Date Taking? Authorizing Provider  aspirin 81 MG tablet Take 81 mg by mouth every evening.     [provider]  empagliflozin (JARDIANCE) 10 MG TABS tablet Take 1 tablet (10 mg total) by mouth daily. 10/10/19   Midge Minium, MD  fenofibrate 160 MG tablet Take 1 tablet (160 mg total) by mouth daily. 10/10/19   Midge Minium, MD  FREESTYLE LITE test strip Use on strip to test sugars 1-2 times daily. Dx. E11.9 05/09/17   Midge Minium, MD  Lancets (FREESTYLE) lancets 1 each by Other route 3 (three) times daily. PRN. E11.9 09/19/17   Midge Minium, MD  lisinopril-hydrochlorothiazide (ZESTORETIC) 20-12.5 MG tablet Take 1 tablet by mouth daily. 10/10/19   Midge Minium, MD  metFORMIN (  GLUCOPHAGE) 1000 MG tablet Take 1 tablet (1,000 mg total) by mouth 2 (two) times daily with a meal. 10/10/19   Midge Minium, MD  Multiple Vitamins-Minerals (PRESERVISION AREDS 2) CAPS Take 1 capsule by mouth 2 (two) times daily.    [provider]  omeprazole (PRILOSEC) 40 MG capsule Take 1 capsule (40 mg total) by mouth daily. 10/10/19   Midge Minium, MD  sertraline (ZOLOFT) 25 MG tablet Take 1 tablet (25 mg total) by mouth daily. 10/10/19   Midge Minium, MD  simvastatin (ZOCOR) 40 MG tablet Take 1 tablet (40 mg total) by mouth every evening. 10/10/19   Midge Minium, MD  triamcinolone ointment (KENALOG) 0.1 % Apply 1 application topically  2 (two) times daily. 09/20/19 09/19/20  Midge Minium, MD    Allergies    Penicillins, Fish oil, and Eggs or egg-derived products  Review of Systems   Review of Systems  All other systems reviewed and are negative.   Physical Exam Updated Vital Signs BP 90/62 (BP Location: Left Arm)   Pulse 79   Temp 98.9 F (37.2 C) (Oral)   Resp 18   Ht 1.702 m (5\' 7" )   Wt 79.4 kg   SpO2 (!) 83%   BMI 27.41 kg/m   Physical Exam Vitals and nursing note reviewed.  Constitutional:      General: He is not in acute distress.    Appearance: Normal appearance. He is obese. He is ill-appearing.  HENT:     Head: Normocephalic.     Right Ear: External ear normal.     Left Ear: External ear normal.     Nose: Nose normal.     Mouth/Throat:     Mouth: Mucous membranes are dry.     Pharynx: Oropharynx is clear.  Eyes:     Pupils: Pupils are equal, round, and reactive to light.  Cardiovascular:     Rate and Rhythm: Normal rate and regular rhythm.     Pulses: Normal pulses.     Heart sounds: Normal heart sounds.  Pulmonary:     Comments: Increased respiratory rate and work of breathing Abdominal:     General: Abdomen is flat.     Palpations: Abdomen is soft.  Musculoskeletal:        General: Normal range of motion.     Cervical back: Normal range of motion.  Skin:    General: Skin is warm.     Capillary Refill: Capillary refill takes less than 2 seconds.  Neurological:     General: No focal deficit present.     Mental Status: He is alert.  Psychiatric:        Mood and Affect: Mood normal.     ED Results / Procedures / Treatments   Labs (all labs ordered are listed, but only abnormal results are displayed) Labs Reviewed  RESPIRATORY PANEL BY RT PCR (FLU A&B, COVID) - Abnormal; Notable for the following components:      Result Value   SARS Coronavirus 2 by RT PCR POSITIVE (*)    All other components within normal limits  COMPREHENSIVE METABOLIC PANEL - Abnormal; Notable for  the following components:   CO2 19 (*)    Glucose, Bld 179 (*)    BUN 99 (*)    Creatinine, Ser 2.04 (*)    Calcium 8.6 (*)    Albumin 2.9 (*)    AST 76 (*)    Alkaline Phosphatase 36 (*)    Total  Bilirubin 1.4 (*)    GFR calc non Af Amer 34 (*)    GFR calc Af Amer 39 (*)    All other components within normal limits  CULTURE, BLOOD (ROUTINE X 2)  CULTURE, BLOOD (ROUTINE X 2)  SARS CORONAVIRUS 2 BY RT PCR (HOSPITAL ORDER, [place] [facility])  CBC  LACTIC ACID, PLASMA  LACTIC ACID, PLASMA  D-DIMER, QUANTITATIVE (NOT AT [facility])  PROCALCITONIN  LACTATE DEHYDROGENASE  FERRITIN  TRIGLYCERIDES  FIBRINOGEN  C-REACTIVE PROTEIN    EKG None  Radiology DG Chest Portable 1 View  Result Date: 10/03/2019 CLINICAL DATA:  COVID-19 positivity with cough and shortness of breath EXAM: PORTABLE CHEST 1 VIEW COMPARISON:  04/20/2017 FINDINGS: Cardiac shadow is within normal limits. Increased patchy airspace opacities are noted bilaterally consistent with the given clinical history. No sizable effusion is seen. No bony abnormality is noted. IMPRESSION: Bilateral airspace opacities consistent with the given clinical history of COVID-19 positivity. Electronically Signed   By: Inez Catalina M.D.   On: 10/06/2019 10:57    Procedures Procedures (including critical care time)  Medications Ordered in ED Medications  0.9 %  sodium chloride infusion (has no administration in time range)    ED Course  I have reviewed the triage vital signs and the nursing notes.  Pertinent labs & imaging results that were available during my care of the patient were reviewed by me and considered in my medical decision making (see chart for details).    MDM Rules/Calculators/A&P                          Patient presents today with covid sxs and positive covid test. Patient with increasing dysnpea and cxr c.w. covid pneumonia BP low at 90/62 and patient with decreased po intake with bun at 90 and  aki with creatinine increased from first prior of 1.29 on 8/27 to 2.04 today. Plan one liter ns given relatively low bp with hypotension with close monitoring as patient with severe bilateral infiltrates.  Covid positive AKI covid pneumonia with new oxygen requirement (sats at 83% on arrival on room air) of 5 liters to keep sats in low 90s  Plan admission for ongoing evaluation and treatment. Discussed with Dr. Roosevelt Locks and will see for admission Final Clinical Impression(s) / ED Diagnoses Final diagnoses:  Pneumonia due to COVID-19 virus  AKI (acute kidney injury) [facility])    Rx / [place] Orders ED Discharge Orders    None       Pattricia Boss, MD 09/25/2019 1408

## 2019-10-21 NOTE — ED Notes (Signed)
Call Arieon Corcoran (daughter) at [phone] for updates.

## 2019-10-21 NOTE — H&P (Signed)
History and Physical    Pedro [name] RDE:[id number] DOB: 01-Aug-1955 DOA: 09/29/2019  PCP: Midge Minium, MD (Confirm with patient/family/NH records and if not entered, this has to be entered at [facility] point of entry) Patient coming from: Home  I have personally briefly reviewed patient's old medical records in [place]  Chief Complaint: SOB, feeling tired.  HPI: Pedro [name] is a 64 y.o. male with medical history significant of HTN, IIDM,HLD, morbid obesity, GERD, anxiety/depression, presented with new onset of COVID-19 symptoms.  And started to feel very tired and shortness of breath dry cough but 10 days ago went to tested in urgent care 7 days ago positive for COVID-19 infection.  Sent home without any further medical treatment.  Denied any loss of taste, nauseous vomit abdominal pain chest pain.  However appetite has significantly decreased, and has not been eating and drinking well for the last 5 to 6 days.  Only has occasional diarrhea 1-3 times a day for last for 5 days, no abdominal pain.  Patient was on vaccinated for personal reasons. ED Course: Oxygen saturation in 70s, chest x-ray showed bilateral extensive multifocal infiltrates compatible with COVID-19 pneumonia, blood pressure AKI creatinine 2.0, BUN 99, glucose 179, WBC 9.9.  Patient stabilized on 4 L oxygen with significant dyspnea. BP borderline low, is to receive 1 L of IV bolus.  Review of Systems: As per HPI otherwise 14 point review of systems negative.    Past Medical History:  Diagnosis Date  . Diabetes mellitus    2  . Diverticulosis   . GERD (gastroesophageal reflux disease)   . Gilbert syndrome   . Hyperlipidemia   . Hypertension   . Internal hemorrhoid   . Pancreatitis   . Pneumonia   . 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 ANTERIOR CERVICAL DECOMP/DISCECTOMY FUSION N/A 04/22/2017   Procedure: ANTERIOR CERVICAL  DECOMPRESSION/DISCECTOMY FUSION 1 LEVEL  C6-7;  Surgeon: Eustace Moore, MD;  Location: [facility];  Service: Neurosurgery;  Laterality: N/A;  . CHOLECYSTECTOMY       reports that he has never smoked. He has never used smokeless tobacco. He reports that he does not drink alcohol and does not use drugs.  Allergies  Allergen Reactions  . Penicillins Hives and Rash    Has patient had a PCN reaction causing immediate rash, facial/tongue/throat swelling, SOB or lightheadedness with hypotension: Yes Has patient had a PCN reaction causing severe rash involving mucus membranes or skin necrosis: No Has patient had a PCN reaction that required hospitalization: No Has patient had a PCN reaction occurring within the last 10 years: Yes If all of the above answers are "NO", then may proceed with Cephalosporin use.   . Fish Oil Nausea And Vomiting and Other (See Comments)    GI upset also   . Eggs Or Egg-Derived Products Nausea And Vomiting    Family History  Problem Relation Age of Onset  . Heart disease Father   . Diabetes Paternal Aunt   . Breast cancer Sister        x 2  . Colon cancer Neg Hx      Prior to Admission medications   Medication Sig Start Date End Date Taking? Authorizing Provider  aspirin 81 MG tablet Take 81 mg by mouth every evening.     [provider]  empagliflozin (JARDIANCE) 10 MG TABS tablet Take 1 tablet (10 mg total) by mouth daily.  10/10/19   Midge Minium, MD  fenofibrate 160 MG tablet Take 1 tablet (160 mg total) by mouth daily. 10/10/19   Midge Minium, MD  FREESTYLE LITE test strip Use on strip to test sugars 1-2 times daily. Dx. E11.9 05/09/17   Midge Minium, MD  Lancets (FREESTYLE) lancets 1 each by Other route 3 (three) times daily. PRN. E11.9 09/19/17   Midge Minium, MD  lisinopril-hydrochlorothiazide (ZESTORETIC) 20-12.5 MG tablet Take 1 tablet by mouth daily. 10/10/19   Midge Minium, MD  metFORMIN (GLUCOPHAGE) 1000 MG  tablet Take 1 tablet (1,000 mg total) by mouth 2 (two) times daily with a meal. 10/10/19   Midge Minium, MD  Multiple Vitamins-Minerals (PRESERVISION AREDS 2) CAPS Take 1 capsule by mouth 2 (two) times daily.    [provider]  omeprazole (PRILOSEC) 40 MG capsule Take 1 capsule (40 mg total) by mouth daily. 10/10/19   Midge Minium, MD  sertraline (ZOLOFT) 25 MG tablet Take 1 tablet (25 mg total) by mouth daily. 10/10/19   Midge Minium, MD  simvastatin (ZOCOR) 40 MG tablet Take 1 tablet (40 mg total) by mouth every evening. 10/10/19   Midge Minium, MD  triamcinolone ointment (KENALOG) 0.1 % Apply 1 application topically 2 (two) times daily. 09/20/19 09/19/20  Midge Minium, MD    Physical Exam: Vitals:   10/05/2019 1000 10/06/2019 1014 10/03/2019 1050 10/16/2019 1234  BP: 97/64  (!) 89/51 90/62  Pulse: 87  81 79  Resp: 18  20 18   Temp: 99.4 F (37.4 C)  98.3 F (36.8 C) 98.9 F (37.2 C)  TempSrc: Oral  Oral Oral  SpO2: (!) 78% 90% 92% (!) 83%  Weight:   79.4 kg   Height:   5\' 7"  (1.702 m)     Constitutional: NAD, calm, comfortable Vitals:   10/09/2019 1000 10/11/2019 1014 10/11/2019 1050 10/20/2019 1234  BP: 97/64  (!) 89/51 90/62  Pulse: 87  81 79  Resp: 18  20 18   Temp: 99.4 F (37.4 C)  98.3 F (36.8 C) 98.9 F (37.2 C)  TempSrc: Oral  Oral Oral  SpO2: (!) 78% 90% 92% (!) 83%  Weight:   79.4 kg   Height:   5\' 7"  (1.702 m)    Eyes: PERRL, lids and conjunctivae normal ENMT: Mucous membranes are dry. Posterior pharynx clear of any exudate or lesions.Normal dentition.  Neck: normal, supple, no masses, no thyromegaly Respiratory: clear to auscultation bilaterally, no wheezing, no crackles.  Increased respiratory effort. No accessory muscle use.  Cardiovascular: Regular rate and rhythm, no murmurs / rubs / gallops. No extremity edema. 2+ pedal pulses. No carotid bruits.  Abdomen: no tenderness, no masses palpated. No hepatosplenomegaly. Bowel sounds  positive.  Musculoskeletal: no clubbing / cyanosis. No joint deformity upper and lower extremities. Good ROM, no contractures. Normal muscle tone.  Skin: no rashes, lesions, ulcers. No induration Neurologic: CN 2-12 grossly intact. Sensation intact, DTR normal. Strength 5/5 in all 4.  Psychiatric: Normal judgment and insight. Alert and oriented x 3. Normal mood.    Labs on Admission: I have personally reviewed following labs and imaging studies  CBC: Recent Labs  Lab 10/15/2019 1012  WBC 9.9  HGB 16.2  HCT 49.2  MCV 91.8  PLT 956   Basic Metabolic Panel: Recent Labs  Lab 10/23/2019 1012  NA 135  K 4.5  CL 101  CO2 19*  GLUCOSE 179*  BUN 99*  CREATININE 2.04*  CALCIUM 8.6*   GFR: Estimated Creatinine Clearance: 37.9 mL/min (A) (by C-G formula based on SCr of 2.04 mg/dL (H)). Liver Function Tests: Recent Labs  Lab 10/20/2019 1012  AST 76*  ALT 37  ALKPHOS 36*  BILITOT 1.4*  PROT 6.9  ALBUMIN 2.9*   No results for input(s): LIPASE, AMYLASE in the last 168 hours. No results for input(s): AMMONIA in the last 168 hours. Coagulation Profile: No results for input(s): INR, PROTIME in the last 168 hours. Cardiac Enzymes: No results for input(s): CKTOTAL, CKMB, CKMBINDEX, TROPONINI in the last 168 hours. BNP (last 3 results) No results for input(s): PROBNP in the last 8760 hours. HbA1C: No results for input(s): HGBA1C in the last 72 hours. CBG: No results for input(s): GLUCAP in the last 168 hours. Lipid Profile: No results for input(s): CHOL, HDL, LDLCALC, TRIG, CHOLHDL, LDLDIRECT in the last 72 hours. Thyroid Function Tests: No results for input(s): TSH, T4TOTAL, FREET4, T3FREE, THYROIDAB in the last 72 hours. Anemia Panel: No results for input(s): VITAMINB12, FOLATE, FERRITIN, TIBC, IRON, RETICCTPCT in the last 72 hours. Urine analysis:    Component Value Date/Time   COLORURINE YELLOW 09/02/2008 2200   APPEARANCEUR CLEAR 09/02/2008 2200   LABSPEC 1.019 09/02/2008  2200   PHURINE 6.5 09/02/2008 2200   GLUCOSEU NEGATIVE 09/02/2008 2200   HGBUR NEGATIVE 09/02/2008 2200   BILIRUBINUR NEGATIVE 09/02/2008 2200   KETONESUR NEGATIVE 09/02/2008 2200   PROTEINUR NEGATIVE 09/02/2008 2200   UROBILINOGEN 1.0 09/02/2008 2200   NITRITE NEGATIVE 09/02/2008 2200   LEUKOCYTESUR  09/02/2008 2200    NEGATIVE MICROSCOPIC NOT DONE ON URINES WITH NEGATIVE PROTEIN, BLOOD, LEUKOCYTES, NITRITE, OR GLUCOSE <1000 mg/dL.    Radiological Exams on Admission: DG Chest Portable 1 View  Result Date: 10/08/2019 CLINICAL DATA:  COVID-19 positivity with cough and shortness of breath EXAM: PORTABLE CHEST 1 VIEW COMPARISON:  04/20/2017 FINDINGS: Cardiac shadow is within normal limits. Increased patchy airspace opacities are noted bilaterally consistent with the given clinical history. No sizable effusion is seen. No bony abnormality is noted. IMPRESSION: Bilateral airspace opacities consistent with the given clinical history of COVID-19 positivity. Electronically Signed   By: Inez Catalina M.D.   On: 10/13/2019 10:57    EKG: Pending  Assessment/Plan Active Problems:   COVID-19 virus infection   COVID-19  (please populate well all problems here in Problem List. (For example, if patient is on BP meds at home and you resume or decide to hold them, it is a problem that needs to be her. Same for CAD, COPD, HLD and so on)  Acute hypoxic respite failure secondary to COVID-19 pneumonia -Start remdesivir, Olumiant, steroid -Incentives bacteremia and recommend frequent turning/prone position -Breathing meds and other supportive measures. -Discussed with wife over the phone, agreed with plan  Hypotension secondary to severe dehydration -Also has signs of hypovolemia, will give a total of 2 L IV boluses and start maintenance IV fluid  AKI -Probably prerenal from dehydration/hypotension, IV replenishment as above and recheck BMP this afternoon and tomorrow.  Consider imaging study depends on  repeated kidney function.  HTN -Hold home BP meds  IIDM -Hold p.o. DM meds -Sliding scale for now  HLD -Continue statin and TriCor  Anxiety depression -Continue SSRI  DVT prophylaxis: Heparin subcu Code Status: Full code Family Communication: Wife over the phone Disposition Plan: Patient with significant COVID-19 pneumonia with extensive lung involvement, complicated with AKI, expect 3 to 5 days hospital stay Consults called: None Admission status: Telemetry admission   Azyiah Bo  Archie Patten MD [facility] Pager [phone]  10/01/2019, 2:34 PM

## 2019-10-22 ENCOUNTER — Inpatient Hospital Stay (HOSPITAL_COMMUNITY): Payer: 59

## 2019-10-22 DIAGNOSIS — R7989 Other specified abnormal findings of blood chemistry: Secondary | ICD-10-CM

## 2019-10-22 DIAGNOSIS — U071 COVID-19: Secondary | ICD-10-CM

## 2019-10-22 LAB — CBC WITH DIFFERENTIAL/PLATELET
Abs Immature Granulocytes: 0 10*3/uL (ref 0.00–0.07)
Basophils Absolute: 0 10*3/uL (ref 0.0–0.1)
Basophils Relative: 0 %
Eosinophils Absolute: 0 10*3/uL (ref 0.0–0.5)
Eosinophils Relative: 0 %
HCT: 44 % (ref 39.0–52.0)
Hemoglobin: 14.6 g/dL (ref 13.0–17.0)
Lymphocytes Relative: 5 %
Lymphs Abs: 0.4 10*3/uL — ABNORMAL LOW (ref 0.7–4.0)
MCH: 31.3 pg (ref 26.0–34.0)
MCHC: 33.2 g/dL (ref 30.0–36.0)
MCV: 94.2 fL (ref 80.0–100.0)
Monocytes Absolute: 0.2 10*3/uL (ref 0.1–1.0)
Monocytes Relative: 3 %
Neutro Abs: 6.8 10*3/uL (ref 1.7–7.7)
Neutrophils Relative %: 92 %
Platelets: 364 10*3/uL (ref 150–400)
RBC: 4.67 MIL/uL (ref 4.22–5.81)
RDW: 13.1 % (ref 11.5–15.5)
WBC: 7.4 10*3/uL (ref 4.0–10.5)
nRBC: 0 % (ref 0.0–0.2)
nRBC: 0 /100 WBC

## 2019-10-22 LAB — COMPREHENSIVE METABOLIC PANEL
ALT: 36 U/L (ref 0–44)
AST: 69 U/L — ABNORMAL HIGH (ref 15–41)
Albumin: 2.3 g/dL — ABNORMAL LOW (ref 3.5–5.0)
Alkaline Phosphatase: 35 U/L — ABNORMAL LOW (ref 38–126)
Anion gap: 11 (ref 5–15)
BUN: 64 mg/dL — ABNORMAL HIGH (ref 8–23)
CO2: 19 mmol/L — ABNORMAL LOW (ref 22–32)
Calcium: 8 mg/dL — ABNORMAL LOW (ref 8.9–10.3)
Chloride: 112 mmol/L — ABNORMAL HIGH (ref 98–111)
Creatinine, Ser: 1.43 mg/dL — ABNORMAL HIGH (ref 0.61–1.24)
GFR calc Af Amer: >60 mL/min (ref >60)
GFR calc non Af Amer: 52 mL/min — ABNORMAL LOW (ref >60)
Glucose, Bld: 176 mg/dL — ABNORMAL HIGH (ref 70–99)
Potassium: 4.5 mmol/L (ref 3.5–5.1)
Sodium: 142 mmol/L (ref 135–145)
Total Bilirubin: 1.1 mg/dL (ref 0.3–1.2)
Total Protein: 5.8 g/dL — ABNORMAL LOW (ref 6.5–8.1)

## 2019-10-22 LAB — MAGNESIUM: Magnesium: 2.6 mg/dL — ABNORMAL HIGH (ref 1.7–2.4)

## 2019-10-22 LAB — CBG MONITORING, ED: Glucose-Capillary: 166 mg/dL — ABNORMAL HIGH (ref 70–99)

## 2019-10-22 LAB — HEMOGLOBIN A1C
Hgb A1c MFr Bld: 7.3 % — ABNORMAL HIGH (ref 4.8–5.6)
Mean Plasma Glucose: 162.81 mg/dL

## 2019-10-22 LAB — FERRITIN: Ferritin: 2286 ng/mL — ABNORMAL HIGH (ref 24–336)

## 2019-10-22 LAB — C-REACTIVE PROTEIN: CRP: 17.5 mg/dL — ABNORMAL HIGH (ref <1.0)

## 2019-10-22 LAB — D-DIMER, QUANTITATIVE: D-Dimer, Quant: 2.69 ug/mL-FEU — ABNORMAL HIGH (ref 0.00–0.50)

## 2019-10-22 LAB — PHOSPHORUS: Phosphorus: 3.9 mg/dL (ref 2.5–4.6)

## 2019-10-22 LAB — GLUCOSE, CAPILLARY: Glucose-Capillary: 146 mg/dL — ABNORMAL HIGH (ref 70–99)

## 2019-10-22 MED ORDER — METHYLPREDNISOLONE SODIUM SUCC 40 MG IJ SOLR: 40.0000 mg | Freq: Two times a day (BID) | INTRAMUSCULAR | Status: DC

## 2019-10-22 MED ORDER — METHYLPREDNISOLONE SODIUM SUCC 40 MG IJ SOLR
40.0000 mg | Freq: Four times a day (QID) | INTRAMUSCULAR | Status: DC
  Administered 2019-10-22 – 2019-10-23 (×7): 40 mg via INTRAVENOUS
  Filled 2019-10-22 (×8): qty 1

## 2019-10-22 MED ORDER — PHENOL 1.4 % MT LIQD
1.0000 | OROMUCOSAL | Status: DC | PRN
  Administered 2019-10-23: 1 via OROMUCOSAL
  Filled 2019-10-22: qty 177

## 2019-10-22 MED ORDER — LINAGLIPTIN 5 MG PO TABS
5.0000 mg | ORAL_TABLET | Freq: Every day | ORAL | Status: DC
  Administered 2019-10-22: 5 mg via ORAL
  Filled 2019-10-22: qty 1

## 2019-10-22 MED ORDER — INSULIN DETEMIR 100 UNIT/ML ~~LOC~~ SOLN
4.0000 [IU] | Freq: Two times a day (BID) | SUBCUTANEOUS | Status: DC
  Administered 2019-10-22 – 2019-10-23 (×3): 4 [IU] via SUBCUTANEOUS
  Filled 2019-10-22 (×5): qty 0.04

## 2019-10-22 NOTE — Progress Notes (Signed)
CSW received consult to call patient's daughter about paperwork. CSW spoke with patient's daughter, [name]. She reported that patient and his spouse (also admitted on the unit) are closing on/selling a house and the paperwork is due to their lawyer by 9/30. Patient's daughter stated she will bring the paperwork to the hospital and CSW can take it up to the RN to have patients both sign. A notary will be present with their daughter. She is aware they are not allowed on the unit.   Manroop Jakubowicz LCSW

## 2019-10-22 NOTE — Plan of Care (Signed)
  Problem: Education: Goal: Knowledge of risk factors and measures for prevention of condition will improve Outcome: Progressing   Problem: Coping: Goal: Psychosocial and spiritual needs will be supported Outcome: Progressing   Problem: Respiratory: Goal: Complications related to the disease process, condition or treatment will be avoided or minimized Outcome: Progressing   Problem: Education: Goal: Knowledge of General Education information will improve Description: Including pain rating scale, medication(s)/side effects and non-pharmacologic comfort measures Outcome: Progressing   Problem: Activity: Goal: Risk for activity intolerance will decrease Outcome: Progressing   Problem: Pain Managment: Goal: General experience of comfort will improve Outcome: Progressing   Problem: Respiratory: Goal: Will maintain a patent airway Outcome: Not Progressing   Problem: Clinical Measurements: Goal: Respiratory complications will improve Outcome: Not Progressing

## 2019-10-22 NOTE — Progress Notes (Signed)
Bilateral lower extremity venous duplex has been completed. Preliminary results can be found in CV Proc through chart review.   10/22/19 5:14 PM Carlos Levering RVT

## 2019-10-22 NOTE — Progress Notes (Addendum)
PROGRESS NOTE    TARRY FOUNTAIN  GUR:[id number] DOB: [birth year] DOA: 09/26/2019 PCP: Midge Minium, MD   No chief complaint on file.   Brief Narrative:  Pedro [name] is Pedro [name] 64 y.o. male with medical history significant of HTN, IIDM,HLD, morbid obesity, GERD, anxiety/depression, presented with new onset of COVID-19 symptoms.  And started to feel very tired and shortness of breath dry cough but 10 days ago went to tested in urgent care 7 days ago positive for COVID-19 infection.  Sent home without any further medical treatment.  Denied any loss of taste, nauseous vomit abdominal pain chest pain.  However appetite has significantly decreased, and has not been eating and drinking well for the last 5 to 6 days.  Only has occasional diarrhea 1-3 times Lylianna Fraiser day for last for 5 days, no abdominal pain.  Patient was on vaccinated for personal reasons. ED Course: Oxygen saturation in 70s, chest x-ray showed bilateral extensive multifocal infiltrates compatible with COVID-19 pneumonia, blood pressure AKI creatinine 2.0, BUN 99, glucose 179, WBC 9.9.  Patient stabilized on 4 L oxygen with significant dyspnea. BP borderline low, is to receive 1 L of IV bolus.   Assessment & Plan:   Active Problems:   COVID-19 virus infection   Pneumonia due to COVID-19 virus  Acute hypoxic respite failure secondary to COVID-19 pneumonia - unvaccinated - CXR with bilateral airspace opacities - CXR 9/29  - currently on 12 L HFNC - continue steroids, remdesivir, baricitinib - procal 0.33,  - strict I/O, daily weights - prone as able, OOB, IS, flutter, therapy - Follow blood cultures  COVID-19 Labs  Recent Labs    10/20/2019 1324 10/01/2019 1501 10/22/19 0358  DDIMER  --  2.43* 2.69*  FERRITIN 2,488*  --  2,286*  LDH  --  756*  --   CRP 21.1*  --  17.5*    Lab Results  Component Value Date   SARSCOV2NAA POSITIVE (Karlin Heilman) 10/11/2019   Elevated D dimer - LE [place] without DVT - consider empiric lovenox +/- CT  PE protocol and/or repeat LE [place] if rising and concern for VTE/PE or sudden worsening in resp status  Hypotension secondary to severe dehydration -improved, continue to monitor - CXR 9/29  AKI -improving, 2/2 hypovolemia - continue IVF - follow UA, consider additional imaging if doesn't return to baseline  HTN -Hold home BP meds  IIDM -Hold p.o. DM meds -basal, SSI, tradjenta - adjust as needed  HLD -Continue statin and TriCor  Anxiety depression -Continue SSRI  DVT prophylaxis: heparin Code Status: full  Family Communication: none at bedside - called wife, no answer (she's also admitted) Disposition:   Status is: Inpatient  Remains inpatient appropriate because:Inpatient level of care appropriate due to severity of illness   Dispo: The patient is from: Home              Anticipated d/c is to: Home              Anticipated d/c date is: > 3 days              Patient currently is not medically stable to d/c.    Consultants:   none  Procedures:  LE [place] Summary:  RIGHT:  - There is no evidence of deep vein thrombosis in the lower extremity.    - No cystic structure found in the popliteal fossa.    LEFT:  - There is no evidence of deep vein thrombosis in the lower extremity.    -  No cystic structure found in the popliteal fossa.   Antimicrobials:  Anti-infectives (From admission, onward)   Start     Dose/Rate Route Frequency Ordered Stop   10/22/19 1000  remdesivir 100 mg in sodium chloride 0.9 % 100 mL IVPB       "Followed by" Linked Group Details   100 mg 200 mL/hr over 30 Minutes Intravenous Daily 10/09/2019 1428 10/26/19 0959   10/10/2019 1600  remdesivir 200 mg in sodium chloride 0.9% 250 mL IVPB       "Followed by" Linked Group Details   200 mg 580 mL/hr over 30 Minutes Intravenous Once 10/02/2019 1428 10/17/2019 1909      Subjective: C/o SOB, feels better than admission  Objective: Vitals:   10/22/19 0900 10/22/19 1102 10/22/19 1200 10/22/19  1635  BP: 128/72  (!) 109/55 112/61  Pulse: 83 (!) 102 84 76  Resp: (!) 29 (!) 33 (!) 40 (!) 41  Temp: 98.1 F (36.7 C) 98.4 F (36.9 C) 99.6 F (37.6 C) 99.2 F (37.3 C)  TempSrc: Axillary  Oral Axillary  SpO2: (!) 89% 95% 90% 90%  Weight:      Height:        Intake/Output Summary (Last 24 hours) at 10/22/2019 1744 Last data filed at 10/22/2019 1500 Gross per 24 hour  Intake 2235.37 ml  Output --  Net 2235.37 ml   Filed Weights   09/25/2019 1050  Weight: 79.4 kg    Examination:  General exam: Appears calm and comfortable  Respiratory system: tachypneic, labored Cardiovascular system: S1 & S2 heard, RRR.  Gastrointestinal system: Abdomen is nondistended, soft and nontender.  Central nervous system: Alert and oriented. No focal neurological deficits. Extremities: no LEE Skin: No rashes, lesions or ulcers Psychiatry: Judgement and insight appear normal. Mood & affect appropriate.     Data Reviewed: I have personally reviewed following labs and imaging studies  CBC: Recent Labs  Lab 09/27/2019 1012 10/22/19 0358  WBC 9.9 7.4  NEUTROABS  --  6.8  HGB 16.2 14.6  HCT 49.2 44.0  MCV 91.8 94.2  PLT 366 315    Basic Metabolic Panel: Recent Labs  Lab 09/30/2019 1012 09/29/2019 1501 10/22/19 0358  NA 135 135 142  K 4.5 4.5 4.5  CL 101 100 112*  CO2 19* 21* 19*  GLUCOSE 179* 178* 176*  BUN 99* 102* 64*  CREATININE 2.04* 2.01* 1.43*  CALCIUM 8.6* 8.6* 8.0*  MG  --   --  2.6*  PHOS  --   --  3.9    GFR: Estimated Creatinine Clearance: 54.1 mL/min (Monnie Gudgel) (by C-G formula based on SCr of 1.43 mg/dL (H)).  Liver Function Tests: Recent Labs  Lab 10/02/2019 1012 10/22/19 0358  AST 76* 69*  ALT 37 36  ALKPHOS 36* 35*  BILITOT 1.4* 1.1  PROT 6.9 5.8*  ALBUMIN 2.9* 2.3*    CBG: Recent Labs  Lab 10/06/2019 1633 10/22/19 0812 10/22/19 1230  GLUCAP 139* 166* 146*     Recent Results (from the past 240 hour(s))  Respiratory Panel by RT PCR (Flu Meenakshi Sazama&B, Covid) -  Nasopharyngeal Swab     Status: Abnormal   Collection Time: 10/22/2019 11:00 AM   Specimen: Nasopharyngeal Swab  Result Value Ref Range Status   SARS Coronavirus 2 by RT PCR POSITIVE (Judianne Seiple) NEGATIVE Final    Comment: emailed L. Berdik RN 13:05 10/06/2019 ([email or web address]) (NOTE) SARS-CoV-2 target nucleic acids are DETECTED.  SARS-CoV-2 RNA is generally detectable in upper respiratory specimens  during  the acute phase of infection. Positive results are indicative of the presence of the identified virus, but do not rule out bacterial infection or co-infection with other pathogens not detected by the test. Clinical correlation with patient history and other diagnostic information is necessary to determine patient infection status. The expected result is Negative.  Fact Sheet for Patients:  PinkCheek.be  Fact Sheet for Healthcare Providers: GravelBags.it  This test is not yet approved or cleared by the [place] FDA and  has been authorized for detection and/or diagnosis of SARS-CoV-2 by FDA under an Emergency Use Authorization (EUA).  This EUA will remain in effect (meaning this test can be used) for the duration of  the COVID-19  declaration under Section 564(b)(1) of the Act, 21 U.S.C. section 360bbb-3(b)(1), unless the authorization is terminated or revoked sooner.      Influenza Nicha Hemann by PCR NEGATIVE NEGATIVE Final   Influenza B by PCR NEGATIVE NEGATIVE Final    Comment: (NOTE) The Xpert Xpress SARS-CoV-2/FLU/RSV assay is intended as an aid in  the diagnosis of influenza from Nasopharyngeal swab specimens and  should not be used as Kawena Lyday sole basis for treatment. Nasal washings and  aspirates are unacceptable for Xpert Xpress SARS-CoV-2/FLU/RSV  testing.  Fact Sheet for Patients: PinkCheek.be  Fact Sheet for Healthcare Providers: GravelBags.it  This test is not yet approved  or cleared by the [place] FDA and  has been authorized for detection and/or diagnosis of SARS-CoV-2 by  FDA under an Emergency Use Authorization (EUA). This EUA will remain  in effect (meaning this test can be used) for the duration of the  Covid-19 declaration under Section 564(b)(1) of the Act, 21  U.S.C. section 360bbb-3(b)(1), unless the authorization is  terminated or revoked. Performed at [facility], [place] [address] [place], [place] [address]   Blood Culture (routine x 2)     Status: None (Preliminary result)   Collection Time: 10/02/2019  2:13 PM   Specimen: BLOOD  Result Value Ref Range Status   Specimen Description BLOOD LEFT ANTECUBITAL  Final   Special Requests   Final    BOTTLES DRAWN AEROBIC AND ANAEROBIC Blood Culture adequate volume   Culture   Final    NO GROWTH < 24 HOURS Performed at [facility], [place] [address], [place], [place] [address]    Report Status PENDING  Incomplete  Blood Culture (routine x 2)     Status: None (Preliminary result)   Collection Time: 10/01/2019  2:30 PM   Specimen: BLOOD RIGHT ARM  Result Value Ref Range Status   Specimen Description BLOOD RIGHT ARM  Final   Special Requests   Final    BOTTLES DRAWN AEROBIC AND ANAEROBIC Blood Culture adequate volume   Culture   Final    NO GROWTH < 24 HOURS Performed at [facility], [place] [address], [place], [place] [address]    Report Status PENDING  Incomplete         Radiology Studies: DG Chest Portable 1 View  Result Date: 10/23/2019 CLINICAL DATA:  COVID-19 positivity with cough and shortness of breath EXAM: PORTABLE CHEST 1 VIEW COMPARISON:  04/20/2017 FINDINGS: Cardiac shadow is within normal limits. Increased patchy airspace opacities are noted bilaterally consistent with the given clinical history. No sizable effusion is seen. No bony abnormality is noted. IMPRESSION: Bilateral airspace opacities consistent with the given clinical history of COVID-19 positivity.  Electronically Signed   By: Inez Catalina M.D.   On:  10/09/2019 10:57   VAS [place] LOWER EXTREMITY VENOUS (DVT)  Result Date: 10/22/2019  Lower Venous DVT Study Indications: Elevated Ddimer.  Risk Factors: COVID 19 positive. Comparison Study: No prior studies. Performing Technologist: Oliver Hum RVT  Examination Guidelines: Edwing Figley complete evaluation includes B-mode imaging, spectral Doppler, color Doppler, and power Doppler as needed of all accessible portions of each vessel. Bilateral testing is considered an integral part of Sakoya Win complete examination. Limited examinations for reoccurring indications may be performed as noted. The reflux portion of the exam is performed with the patient in reverse Trendelenburg.  +---------+---------------+---------+-----------+----------+--------------+ RIGHT    CompressibilityPhasicitySpontaneityPropertiesThrombus Aging +---------+---------------+---------+-----------+----------+--------------+ CFV      Full           Yes      Yes                                 +---------+---------------+---------+-----------+----------+--------------+ SFJ      Full                                                        +---------+---------------+---------+-----------+----------+--------------+ FV Prox  Full                                                        +---------+---------------+---------+-----------+----------+--------------+ FV Mid   Full                                                        +---------+---------------+---------+-----------+----------+--------------+ FV DistalFull                                                        +---------+---------------+---------+-----------+----------+--------------+ PFV      Full                                                        +---------+---------------+---------+-----------+----------+--------------+ POP      Full           Yes      Yes                                  +---------+---------------+---------+-----------+----------+--------------+ PTV      Full                                                        +---------+---------------+---------+-----------+----------+--------------+ PERO     Full                                                        +---------+---------------+---------+-----------+----------+--------------+   +---------+---------------+---------+-----------+----------+--------------+  LEFT     CompressibilityPhasicitySpontaneityPropertiesThrombus Aging +---------+---------------+---------+-----------+----------+--------------+ CFV      Full           Yes      Yes                                 +---------+---------------+---------+-----------+----------+--------------+ SFJ      Full                                                        +---------+---------------+---------+-----------+----------+--------------+ FV Prox  Full                                                        +---------+---------------+---------+-----------+----------+--------------+ FV Mid   Full                                                        +---------+---------------+---------+-----------+----------+--------------+ FV DistalFull                                                        +---------+---------------+---------+-----------+----------+--------------+ PFV      Full                                                        +---------+---------------+---------+-----------+----------+--------------+ POP      Full           Yes      Yes                                 +---------+---------------+---------+-----------+----------+--------------+ PTV      Full                                                        +---------+---------------+---------+-----------+----------+--------------+ PERO     Full                                                         +---------+---------------+---------+-----------+----------+--------------+     Summary: RIGHT: - There is no evidence of deep vein thrombosis in the lower extremity.  - No cystic structure found in the popliteal fossa.  LEFT: - There is no evidence of deep vein thrombosis in the lower extremity.  - No  cystic structure found in the popliteal fossa.  *See table(s) above for measurements and observations.    Preliminary         Scheduled Meds: . aspirin EC  81 mg Oral QPM  . baricitinib  2 mg Oral Daily  . fenofibrate  160 mg Oral Daily  . guaiFENesin  1,200 mg Oral BID  . heparin  5,000 Units Subcutaneous Q12H  . insulin aspart  0-9 Units Subcutaneous TID WC  . Ipratropium-Albuterol  1 puff Inhalation Q6H  . linagliptin  5 mg Oral Daily  . methylPREDNISolone (SOLU-MEDROL) injection  40 mg Intravenous Q6H   Followed by  . [START ON 10/25/2019] methylPREDNISolone (SOLU-MEDROL) injection  40 mg Intravenous Q12H  . pantoprazole  40 mg Oral Daily  . sertraline  25 mg Oral Daily  . simvastatin  40 mg Oral QPM  . triamcinolone ointment  1 application Topical BID   Continuous Infusions: . sodium chloride 1,000 mL (10/22/19 1702)  . remdesivir 100 mg in NS 100 mL 100 mg (10/22/19 1042)     LOS: 1 day    Time spent: over 69 min    Fayrene Helper, MD [facility]   To contact the attending provider between 7A-7P or the covering provider during after hours 7P-7A, please log into the web site [email or web address] and access using universal [place] password for that web site. If you do not have the password, please call the hospital operator.  10/22/2019, 5:44 PM

## 2019-10-22 NOTE — Progress Notes (Signed)
   10/22/19 0900  Assess: MEWS Score  Temp 98.1 F (36.7 C)  BP 128/72  Pulse Rate 83  ECG Heart Rate 88  Resp (!) 29  SpO2 (!) 89 %  O2 Device Non-rebreather Mask  O2 Flow Rate (L/min) 15 L/min  Assess: MEWS Score  MEWS Temp 0  MEWS Systolic 0  MEWS Pulse 0  MEWS RR 2  MEWS LOC 0  MEWS Score 2  MEWS Score Color Yellow  Assess: if the MEWS score is Yellow or Red  Were vital signs taken at a resting state? Yes  Focused Assessment No change from prior assessment  Early Detection of Sepsis Score *See Row Information* Low  MEWS guidelines implemented *See Row Information* Yes  Treat  MEWS Interventions Escalated (See documentation below)  Take Vital Signs  Increase Vital Sign Frequency  Yellow: Q 2hr X 2 then Q 4hr X 2, if remains yellow, continue Q 4hrs  Escalate  MEWS: Escalate Yellow: discuss with charge nurse/RN and consider discussing with provider and RRT  Notify: Charge Nurse/RN  Name of Charge Nurse/RN Notified Sarah, RN  Date Charge Nurse/RN Notified 10/22/19  Time Charge Nurse/RN Notified 0945

## 2019-10-23 ENCOUNTER — Inpatient Hospital Stay (HOSPITAL_COMMUNITY): Payer: 59

## 2019-10-23 ENCOUNTER — Inpatient Hospital Stay: Payer: Self-pay

## 2019-10-23 DIAGNOSIS — U071 COVID-19: Principal | ICD-10-CM

## 2019-10-23 DIAGNOSIS — R579 Shock, unspecified: Secondary | ICD-10-CM

## 2019-10-23 DIAGNOSIS — J80 Acute respiratory distress syndrome: Secondary | ICD-10-CM

## 2019-10-23 DIAGNOSIS — J9601 Acute respiratory failure with hypoxia: Secondary | ICD-10-CM

## 2019-10-23 DIAGNOSIS — J1282 Pneumonia due to coronavirus disease 2019: Secondary | ICD-10-CM

## 2019-10-23 LAB — CBC
HCT: 51.6 % (ref 39.0–52.0)
Hemoglobin: 15.5 g/dL (ref 13.0–17.0)
MCH: 30.3 pg (ref 26.0–34.0)
MCHC: 30 g/dL (ref 30.0–36.0)
MCV: 100.8 fL — ABNORMAL HIGH (ref 80.0–100.0)
Platelets: 417 10*3/uL — ABNORMAL HIGH (ref 150–400)
RBC: 5.12 MIL/uL (ref 4.22–5.81)
RDW: 13.5 % (ref 11.5–15.5)
WBC: 15.8 10*3/uL — ABNORMAL HIGH (ref 4.0–10.5)
nRBC: 0.1 % (ref 0.0–0.2)

## 2019-10-23 LAB — BLOOD GAS, ARTERIAL
Acid-base deficit: 1.6 mmol/L (ref 0.0–2.0)
Bicarbonate: 22.3 mmol/L (ref 20.0–28.0)
Drawn by: 55062
FIO2: 100
O2 Saturation: 84.9 %
Patient temperature: 37.1
pCO2 arterial: 35.7 mmHg (ref 32.0–48.0)
pH, Arterial: 7.413 (ref 7.350–7.450)
pO2, Arterial: 50 mmHg — ABNORMAL LOW (ref 83.0–108.0)

## 2019-10-23 LAB — COMPREHENSIVE METABOLIC PANEL
ALT: 30 U/L (ref 0–44)
ALT: 144 U/L — ABNORMAL HIGH (ref 0–44)
AST: 57 U/L — ABNORMAL HIGH (ref 15–41)
AST: 318 U/L — ABNORMAL HIGH (ref 15–41)
Albumin: 2 g/dL — ABNORMAL LOW (ref 3.5–5.0)
Albumin: 1.4 g/dL — ABNORMAL LOW (ref 3.5–5.0)
Alkaline Phosphatase: 48 U/L (ref 38–126)
Alkaline Phosphatase: 92 U/L (ref 38–126)
Anion gap: 9 (ref 5–15)
Anion gap: 11 (ref 5–15)
BUN: 41 mg/dL — ABNORMAL HIGH (ref 8–23)
BUN: 42 mg/dL — ABNORMAL HIGH (ref 8–23)
CO2: 24 mmol/L (ref 22–32)
CO2: 17 mmol/L — ABNORMAL LOW (ref 22–32)
Calcium: 8.1 mg/dL — ABNORMAL LOW (ref 8.9–10.3)
Calcium: 5.8 mg/dL — CL (ref 8.9–10.3)
Chloride: 114 mmol/L — ABNORMAL HIGH (ref 98–111)
Chloride: 121 mmol/L — ABNORMAL HIGH (ref 98–111)
Creatinine, Ser: 1.25 mg/dL — ABNORMAL HIGH (ref 0.61–1.24)
Creatinine, Ser: 1.35 mg/dL — ABNORMAL HIGH (ref 0.61–1.24)
GFR calc Af Amer: >60 mL/min (ref >60)
GFR calc Af Amer: >60 mL/min (ref >60)
GFR calc non Af Amer: >60 mL/min (ref >60)
GFR calc non Af Amer: 56 mL/min — ABNORMAL LOW (ref >60)
Glucose, Bld: 157 mg/dL — ABNORMAL HIGH (ref 70–99)
Glucose, Bld: 206 mg/dL — ABNORMAL HIGH (ref 70–99)
Potassium: 4.8 mmol/L (ref 3.5–5.1)
Potassium: 4.4 mmol/L (ref 3.5–5.1)
Sodium: 147 mmol/L — ABNORMAL HIGH (ref 135–145)
Sodium: 149 mmol/L — ABNORMAL HIGH (ref 135–145)
Total Bilirubin: 0.8 mg/dL (ref 0.3–1.2)
Total Bilirubin: 1.5 mg/dL — ABNORMAL HIGH (ref 0.3–1.2)
Total Protein: 5.7 g/dL — ABNORMAL LOW (ref 6.5–8.1)
Total Protein: 4 g/dL — ABNORMAL LOW (ref 6.5–8.1)

## 2019-10-23 LAB — D-DIMER, QUANTITATIVE
D-Dimer, Quant: 9.03 ug/mL-FEU — ABNORMAL HIGH (ref 0.00–0.50)
D-Dimer, Quant: 17.12 ug/mL-FEU — ABNORMAL HIGH (ref 0.00–0.50)

## 2019-10-23 LAB — POCT I-STAT 7, (LYTES, BLD GAS, ICA,H+H)
Acid-base deficit: 7 mmol/L — ABNORMAL HIGH (ref 0.0–2.0)
Bicarbonate: 25.1 mmol/L (ref 20.0–28.0)
Calcium, Ion: 1.23 mmol/L (ref 1.15–1.40)
HCT: 47 % (ref 39.0–52.0)
Hemoglobin: 16 g/dL (ref 13.0–17.0)
O2 Saturation: 63 %
Potassium: 4.7 mmol/L (ref 3.5–5.1)
Sodium: 151 mmol/L — ABNORMAL HIGH (ref 135–145)
TCO2: 27 mmol/L (ref 22–32)
pCO2 arterial: 80.9 mmHg (ref 32.0–48.0)
pH, Arterial: 7.099 — CL (ref 7.350–7.450)
pO2, Arterial: 45 mmHg — ABNORMAL LOW (ref 83.0–108.0)

## 2019-10-23 LAB — CBC WITH DIFFERENTIAL/PLATELET
Abs Immature Granulocytes: 0.04 10*3/uL (ref 0.00–0.07)
Basophils Absolute: 0 10*3/uL (ref 0.0–0.1)
Basophils Relative: 1 %
Eosinophils Absolute: 0 10*3/uL (ref 0.0–0.5)
Eosinophils Relative: 0 %
HCT: 46.4 % (ref 39.0–52.0)
Hemoglobin: 15.4 g/dL (ref 13.0–17.0)
Immature Granulocytes: 1 %
Lymphocytes Relative: 9 %
Lymphs Abs: 0.4 10*3/uL — ABNORMAL LOW (ref 0.7–4.0)
MCH: 31.1 pg (ref 26.0–34.0)
MCHC: 33.2 g/dL (ref 30.0–36.0)
MCV: 93.7 fL (ref 80.0–100.0)
Monocytes Absolute: 0.1 10*3/uL (ref 0.1–1.0)
Monocytes Relative: 3 %
Neutro Abs: 3.5 10*3/uL (ref 1.7–7.7)
Neutrophils Relative %: 86 %
Platelets: 339 10*3/uL (ref 150–400)
RBC: 4.95 MIL/uL (ref 4.22–5.81)
RDW: 13.2 % (ref 11.5–15.5)
WBC: 4.1 10*3/uL (ref 4.0–10.5)
nRBC: 0 % (ref 0.0–0.2)

## 2019-10-23 LAB — GLUCOSE, CAPILLARY
Glucose-Capillary: 193 mg/dL — ABNORMAL HIGH (ref 70–99)
Glucose-Capillary: 147 mg/dL — ABNORMAL HIGH (ref 70–99)
Glucose-Capillary: 132 mg/dL — ABNORMAL HIGH (ref 70–99)
Glucose-Capillary: 199 mg/dL — ABNORMAL HIGH (ref 70–99)
Glucose-Capillary: 368 mg/dL — ABNORMAL HIGH (ref 70–99)

## 2019-10-23 LAB — MAGNESIUM
Magnesium: 2.6 mg/dL — ABNORMAL HIGH (ref 1.7–2.4)
Magnesium: 2.4 mg/dL (ref 1.7–2.4)

## 2019-10-23 LAB — C-REACTIVE PROTEIN: CRP: 27.7 mg/dL — ABNORMAL HIGH (ref <1.0)

## 2019-10-23 LAB — LACTIC ACID, PLASMA: Lactic Acid, Venous: 3.7 mmol/L (ref 0.5–1.9)

## 2019-10-23 LAB — PHOSPHORUS
Phosphorus: 1.8 mg/dL — ABNORMAL LOW (ref 2.5–4.6)
Phosphorus: 6.8 mg/dL — ABNORMAL HIGH (ref 2.5–4.6)

## 2019-10-23 LAB — MRSA PCR SCREENING: MRSA by PCR: NEGATIVE

## 2019-10-23 LAB — FERRITIN: Ferritin: 1911 ng/mL — ABNORMAL HIGH (ref 24–336)

## 2019-10-23 MED ORDER — FENTANYL CITRATE (PF) 100 MCG/2ML IJ SOLN
50.0000 ug | Freq: Once | INTRAMUSCULAR | Status: AC
  Administered 2019-10-23: 50 ug via INTRAVENOUS

## 2019-10-23 MED ORDER — ASPIRIN 81 MG PO CHEW
81.0000 mg | CHEWABLE_TABLET | Freq: Every day | ORAL | Status: DC
  Administered 2019-10-23: 81 mg
  Filled 2019-10-23: qty 1

## 2019-10-23 MED ORDER — FREE WATER: 200.0000 mL | Freq: Four times a day (QID) | Status: DC

## 2019-10-23 MED ORDER — NOREPINEPHRINE 4 MG/250ML-% IV SOLN
0.0000 ug/min | INTRAVENOUS | Status: DC
  Administered 2019-10-23: 4 ug/min via INTRAVENOUS
  Administered 2019-10-23 (×2): 40 ug/min via INTRAVENOUS
  Administered 2019-10-23: 38 ug/min via INTRAVENOUS
  Administered 2019-10-23: 36 ug/min via INTRAVENOUS
  Administered 2019-10-24: 40 ug/min via INTRAVENOUS
  Filled 2019-10-23 (×4): qty 250
  Filled 2019-10-23: qty 500
  Filled 2019-10-23: qty 250

## 2019-10-23 MED ORDER — AMIODARONE HCL IN DEXTROSE 360-4.14 MG/200ML-% IV SOLN
INTRAVENOUS | Status: AC
  Filled 2019-10-23: qty 200

## 2019-10-23 MED ORDER — FENTANYL 2500MCG IN NS 250ML (10MCG/ML) PREMIX INFUSION
50.0000 ug/h | INTRAVENOUS | Status: DC
  Administered 2019-10-23: 100 ug/h via INTRAVENOUS
  Filled 2019-10-23: qty 250

## 2019-10-23 MED ORDER — VECURONIUM BROMIDE 10 MG IV SOLR: 10.0000 mg | INTRAVENOUS | Status: DC | PRN

## 2019-10-23 MED ORDER — PANTOPRAZOLE SODIUM 40 MG PO PACK
40.0000 mg | PACK | Freq: Every day | ORAL | Status: DC
  Administered 2019-10-23: 40 mg
  Filled 2019-10-23: qty 20

## 2019-10-23 MED ORDER — FENTANYL 2500MCG IN NS 250ML (10MCG/ML) PREMIX INFUSION: 50.0000 ug/h | INTRAVENOUS | Status: DC

## 2019-10-23 MED ORDER — VECURONIUM BROMIDE 10 MG IV SOLR: 10.0000 mg | Freq: Once | INTRAVENOUS | Status: AC

## 2019-10-23 MED ORDER — FENTANYL CITRATE (PF) 100 MCG/2ML IJ SOLN: 100.0000 ug | Freq: Once | INTRAMUSCULAR | Status: AC

## 2019-10-23 MED ORDER — FENTANYL CITRATE (PF) 100 MCG/2ML IJ SOLN: 50.0000 ug | Freq: Once | INTRAMUSCULAR | Status: DC

## 2019-10-23 MED ORDER — CALCIUM GLUCONATE-NACL 1-0.675 GM/50ML-% IV SOLN
1.0000 g | Freq: Once | INTRAVENOUS | Status: AC
  Administered 2019-10-23: 1000 mg via INTRAVENOUS
  Filled 2019-10-23: qty 50

## 2019-10-23 MED ORDER — POLYETHYLENE GLYCOL 3350 17 G PO PACK
17.0000 g | PACK | Freq: Every day | ORAL | Status: DC
  Administered 2019-10-23: 17 g via ORAL
  Filled 2019-10-23: qty 1

## 2019-10-23 MED ORDER — FENTANYL CITRATE (PF) 100 MCG/2ML IJ SOLN
INTRAMUSCULAR | Status: AC
  Administered 2019-10-23: 100 ug via INTRAVENOUS
  Filled 2019-10-23: qty 2

## 2019-10-23 MED ORDER — VECURONIUM BROMIDE 10 MG IV SOLR
INTRAVENOUS | Status: AC
  Administered 2019-10-23: 10 mg via INTRAVENOUS
  Filled 2019-10-23: qty 10

## 2019-10-23 MED ORDER — HEPARIN SODIUM (PORCINE) 5000 UNIT/ML IJ SOLN
5000.0000 [IU] | Freq: Three times a day (TID) | INTRAMUSCULAR | Status: DC
  Administered 2019-10-23 (×2): 5000 [IU] via SUBCUTANEOUS
  Filled 2019-10-23 (×2): qty 1

## 2019-10-23 MED ORDER — PROPOFOL 1000 MG/100ML IV EMUL
25.0000 ug/kg/min | INTRAVENOUS | Status: DC
  Administered 2019-10-23: 25 ug/kg/min via INTRAVENOUS

## 2019-10-23 MED ORDER — SERTRALINE HCL 25 MG PO TABS
25.0000 mg | ORAL_TABLET | Freq: Every day | ORAL | Status: DC
  Administered 2019-10-23: 25 mg
  Filled 2019-10-23: qty 1

## 2019-10-23 MED ORDER — GUAIFENESIN-DM 100-10 MG/5ML PO SYRP
10.0000 mL | ORAL_SOLUTION | ORAL | Status: DC | PRN
  Administered 2019-10-23: 10 mL via ORAL
  Filled 2019-10-23 (×2): qty 10

## 2019-10-23 MED ORDER — AMIODARONE HCL IN DEXTROSE 360-4.14 MG/200ML-% IV SOLN
60.0000 mg/h | INTRAVENOUS | Status: AC
  Administered 2019-10-23 (×2): 60 mg/h via INTRAVENOUS

## 2019-10-23 MED ORDER — ETOMIDATE 2 MG/ML IV SOLN: 20.0000 mg | Freq: Once | INTRAVENOUS | Status: AC

## 2019-10-23 MED ORDER — PROSOURCE TF PO LIQD
45.0000 mL | Freq: Two times a day (BID) | ORAL | Status: DC
  Administered 2019-10-23: 45 mL
  Filled 2019-10-23: qty 45

## 2019-10-23 MED ORDER — ONDANSETRON HCL 4 MG/2ML IJ SOLN: 4.0000 mg | Freq: Four times a day (QID) | INTRAMUSCULAR | Status: DC | PRN

## 2019-10-23 MED ORDER — GUAIFENESIN-DM 100-10 MG/5ML PO SYRP
10.0000 mL | ORAL_SOLUTION | ORAL | Status: DC | PRN
  Filled 2019-10-23: qty 10

## 2019-10-23 MED ORDER — SODIUM CHLORIDE 0.9% FLUSH
10.0000 mL | Freq: Two times a day (BID) | INTRAVENOUS | Status: DC
  Administered 2019-10-23 (×2): 10 mL

## 2019-10-23 MED ORDER — ACETAMINOPHEN 325 MG PO TABS: 650.0000 mg | ORAL_TABLET | Freq: Four times a day (QID) | ORAL | Status: DC | PRN

## 2019-10-23 MED ORDER — SODIUM CHLORIDE 0.9% FLUSH: 10.0000 mL | INTRAVENOUS | Status: DC | PRN

## 2019-10-23 MED ORDER — LACTATED RINGERS IV BOLUS
1000.0000 mL | INTRAVENOUS | Status: AC
  Administered 2019-10-23: 1000 mL via INTRAVENOUS

## 2019-10-23 MED ORDER — PROPOFOL 1000 MG/100ML IV EMUL
INTRAVENOUS | Status: AC
  Administered 2019-10-23: 10 ug/kg/min via INTRAVENOUS
  Filled 2019-10-23: qty 100

## 2019-10-23 MED ORDER — VECURONIUM BROMIDE 10 MG IV SOLR
0.0000 ug/kg/min | INTRAVENOUS | Status: DC
  Administered 2019-10-23: 1 ug/kg/min via INTRAVENOUS
  Filled 2019-10-23: qty 100

## 2019-10-23 MED ORDER — AMIODARONE IV BOLUS ONLY 150 MG/100ML
150.0000 mg | Freq: Once | INTRAVENOUS | Status: AC
  Administered 2019-10-23: 150 mg via INTRAVENOUS

## 2019-10-23 MED ORDER — METOPROLOL TARTRATE 5 MG/5ML IV SOLN
INTRAVENOUS | Status: AC
  Administered 2019-10-23: 5 mg via INTRAVENOUS
  Filled 2019-10-23: qty 10

## 2019-10-23 MED ORDER — SIMVASTATIN 20 MG PO TABS: 20.0000 mg | ORAL_TABLET | Freq: Every evening | ORAL | Status: DC

## 2019-10-23 MED ORDER — VECURONIUM BOLUS VIA INFUSION
10.0000 mg | Freq: Once | INTRAVENOUS | Status: AC
  Administered 2019-10-23: 10 mg via INTRAVENOUS
  Filled 2019-10-23: qty 10

## 2019-10-23 MED ORDER — CHLORHEXIDINE GLUCONATE CLOTH 2 % EX PADS: 6.0000 | MEDICATED_PAD | Freq: Every day | CUTANEOUS | Status: DC

## 2019-10-23 MED ORDER — FENOFIBRATE 160 MG PO TABS
160.0000 mg | ORAL_TABLET | Freq: Every day | ORAL | Status: DC
  Administered 2019-10-23: 160 mg
  Filled 2019-10-23: qty 1

## 2019-10-23 MED ORDER — ROCURONIUM BROMIDE 10 MG/ML (PF) SYRINGE
PREFILLED_SYRINGE | INTRAVENOUS | Status: AC
  Filled 2019-10-23: qty 10

## 2019-10-23 MED ORDER — ORAL CARE MOUTH RINSE
15.0000 mL | OROMUCOSAL | Status: DC
  Administered 2019-10-23 (×4): 15 mL via OROMUCOSAL

## 2019-10-23 MED ORDER — VITAL AF 1.2 CAL PO LIQD: 1000.0000 mL | ORAL | Status: DC

## 2019-10-23 MED ORDER — PROPOFOL 1000 MG/100ML IV EMUL
0.0000 ug/kg/min | INTRAVENOUS | Status: DC
  Filled 2019-10-23: qty 100

## 2019-10-23 MED ORDER — FENTANYL BOLUS VIA INFUSION
50.0000 ug | INTRAVENOUS | Status: DC | PRN
  Administered 2019-10-23: 50 ug via INTRAVENOUS
  Filled 2019-10-23: qty 50

## 2019-10-23 MED ORDER — FENTANYL BOLUS VIA INFUSION
50.0000 ug | INTRAVENOUS | Status: DC | PRN
  Filled 2019-10-23: qty 50

## 2019-10-23 MED ORDER — SODIUM CHLORIDE 0.9 % IV SOLN
250.0000 mL | INTRAVENOUS | Status: DC
  Administered 2019-10-23: 250 mL via INTRAVENOUS

## 2019-10-23 MED ORDER — MIDAZOLAM HCL 2 MG/2ML IJ SOLN
INTRAMUSCULAR | Status: AC
  Administered 2019-10-23: 2 mg via INTRAVENOUS
  Filled 2019-10-23: qty 4

## 2019-10-23 MED ORDER — SIMVASTATIN 20 MG PO TABS
40.0000 mg | ORAL_TABLET | Freq: Every evening | ORAL | Status: DC
  Administered 2019-10-23: 40 mg
  Filled 2019-10-23: qty 2

## 2019-10-23 MED ORDER — EPINEPHRINE HCL 5 MG/250ML IV SOLN IN NS
INTRAVENOUS | Status: AC
  Administered 2019-10-23: 5 ug/min via INTRAVENOUS
  Filled 2019-10-23: qty 250

## 2019-10-23 MED ORDER — ORAL CARE MOUTH RINSE
15.0000 mL | Freq: Two times a day (BID) | OROMUCOSAL | Status: DC
  Administered 2019-10-23: 15 mL via OROMUCOSAL

## 2019-10-23 MED ORDER — FREE WATER
200.0000 mL | Status: DC
  Administered 2019-10-23 (×2): 200 mL

## 2019-10-23 MED ORDER — AMIODARONE LOAD VIA INFUSION
150.0000 mg | Freq: Once | INTRAVENOUS | Status: AC
  Administered 2019-10-23: 150 mg via INTRAVENOUS
  Filled 2019-10-23: qty 83.34

## 2019-10-23 MED ORDER — DOCUSATE SODIUM 50 MG/5ML PO LIQD
100.0000 mg | Freq: Two times a day (BID) | ORAL | Status: DC
  Administered 2019-10-23 (×2): 100 mg via ORAL
  Filled 2019-10-23 (×2): qty 10

## 2019-10-23 MED ORDER — CHLORHEXIDINE GLUCONATE CLOTH 2 % EX PADS
6.0000 | MEDICATED_PAD | Freq: Every day | CUTANEOUS | Status: DC
  Administered 2019-10-23: 6 via TOPICAL

## 2019-10-23 MED ORDER — VASOPRESSIN 20 UNITS/100 ML INFUSION FOR SHOCK
0.0000 [IU]/min | INTRAVENOUS | Status: DC
  Administered 2019-10-23 (×2): 0.03 [IU]/min via INTRAVENOUS
  Filled 2019-10-23: qty 100

## 2019-10-23 MED ORDER — CHLORHEXIDINE GLUCONATE 0.12 % MT SOLN
15.0000 mL | Freq: Two times a day (BID) | OROMUCOSAL | Status: DC
  Administered 2019-10-23 (×3): 15 mL via OROMUCOSAL
  Filled 2019-10-23: qty 15

## 2019-10-23 MED ORDER — ETOMIDATE 2 MG/ML IV SOLN
INTRAVENOUS | Status: AC
  Administered 2019-10-23: 20 mg via INTRAVENOUS
  Filled 2019-10-23: qty 20

## 2019-10-23 MED ORDER — CHLORHEXIDINE GLUCONATE 0.12% ORAL RINSE (MEDLINE KIT)
15.0000 mL | Freq: Two times a day (BID) | OROMUCOSAL | Status: DC
  Administered 2019-10-23 (×2): 15 mL via OROMUCOSAL

## 2019-10-23 MED ORDER — METOPROLOL TARTRATE 5 MG/5ML IV SOLN: 5.0000 mg | INTRAVENOUS | Status: AC

## 2019-10-23 MED ORDER — SODIUM PHOSPHATES 45 MMOLE/15ML IV SOLN
10.0000 mmol | Freq: Once | INTRAVENOUS | Status: AC
  Administered 2019-10-23: 10 mmol via INTRAVENOUS
  Filled 2019-10-23: qty 3.33

## 2019-10-23 MED ORDER — EPINEPHRINE HCL 5 MG/250ML IV SOLN IN NS
0.5000 ug/min | INTRAVENOUS | Status: DC
  Administered 2019-10-23: 11 ug/min via INTRAVENOUS
  Filled 2019-10-23: qty 250

## 2019-10-23 MED ORDER — BARICITINIB 2 MG PO TABS
2.0000 mg | ORAL_TABLET | Freq: Every day | ORAL | Status: DC
  Administered 2019-10-23: 2 mg
  Filled 2019-10-23: qty 1

## 2019-10-23 MED ORDER — AMIODARONE HCL IN DEXTROSE 360-4.14 MG/200ML-% IV SOLN
30.0000 mg/h | INTRAVENOUS | Status: DC
  Administered 2019-10-23: 30 mg/h via INTRAVENOUS
  Filled 2019-10-23 (×2): qty 200

## 2019-10-23 MED ORDER — ARTIFICIAL TEARS OPHTHALMIC OINT
1.0000 "application " | TOPICAL_OINTMENT | Freq: Three times a day (TID) | OPHTHALMIC | Status: DC
  Administered 2019-10-23 (×2): 1 via OPHTHALMIC
  Filled 2019-10-23: qty 3.5

## 2019-10-23 MED ORDER — DEXMEDETOMIDINE HCL IN NACL 400 MCG/100ML IV SOLN
0.4000 ug/kg/h | INTRAVENOUS | Status: DC
  Administered 2019-10-23: 0.4 ug/kg/h via INTRAVENOUS
  Filled 2019-10-23: qty 100

## 2019-10-23 MED ORDER — ONDANSETRON HCL 4 MG PO TABS: 4.0000 mg | ORAL_TABLET | Freq: Four times a day (QID) | ORAL | Status: DC | PRN

## 2019-10-23 MED ORDER — ROCURONIUM BROMIDE 50 MG/5ML IV SOLN
50.0000 mg | Freq: Once | INTRAVENOUS | Status: AC
  Administered 2019-10-23: 50 mg via INTRAVENOUS
  Filled 2019-10-23: qty 5

## 2019-10-23 MED ORDER — SODIUM CHLORIDE 0.9 % IV SOLN
100.0000 mg | Freq: Every day | INTRAVENOUS | Status: DC
  Administered 2019-10-23: 100 mg via INTRAVENOUS
  Filled 2019-10-23: qty 20

## 2019-10-23 MED ORDER — ADULT MULTIVITAMIN W/MINERALS CH
1.0000 | ORAL_TABLET | Freq: Every day | ORAL | Status: DC
  Administered 2019-10-23: 1
  Filled 2019-10-23: qty 1

## 2019-10-23 MED ORDER — MIDAZOLAM HCL 2 MG/2ML IJ SOLN: 2.0000 mg | Freq: Once | INTRAMUSCULAR | Status: AC

## 2019-10-23 NOTE — Procedures (Signed)
Intubation Procedure Note  LLOYD AYO  [id number]  Apr 06, 1955  Date:10/23/19  Time:12:18 PM   Provider Performing:Faatima Tench Mauricio Po    Procedure: Intubation ([address]00)  Indication(s) Respiratory Failure  Consent Risks of the procedure as well as the alternatives and risks of each were explained to the patient and/or caregiver.  Consent for the procedure was obtained and is signed in the bedside chart   Anesthesia Etomidate, Versed, Fentanyl and Rocuronium   Time Out Verified patient identification, verified procedure, site/side was marked, verified correct patient position, special equipment/implants available, medications/allergies/relevant history reviewed, required imaging and test results available.   Sterile Technique Usual hand hygeine, masks, and gloves were used   Procedure Description Patient positioned in bed supine.  Sedation given as noted above.  Patient was intubated with endotracheal tube using Glidescope.  View was Grade 1 full glottis .  Number of attempts was 1.  Colorimetric CO2 detector was consistent with tracheal placement. ETT secured at 24cm at the lip.    Complications/Tolerance None; patient tolerated the procedure well. Chest X-ray is ordered to verify placement.   EBL none   Specimen(s) None

## 2019-10-23 NOTE — Progress Notes (Signed)
  Amiodarone Drug - Drug Interaction Consult Note  Recommendations: Will reduce simvastatin dose down to 20 mg while on amiodarone - monitor vitals.   Amiodarone is metabolized by the cytochrome P450 system and therefore has the potential to cause many drug interactions. Amiodarone has an average plasma half-life of 50 days (range 20 to 100 days).   There is potential for drug interactions to occur several weeks or months after stopping treatment and the onset of drug interactions may be slow after initiating amiodarone.   [x]  Statins: Increased risk of myopathy. Simvastatin- restrict dose to 20mg  daily. Other statins: counsel patients to report any muscle pain or weakness immediately.  []  Anticoagulants: Amiodarone can increase anticoagulant effect. Consider warfarin dose reduction. Patients should be monitored closely and the dose of anticoagulant altered accordingly, remembering that amiodarone levels take several weeks to stabilize.  []  Antiepileptics: Amiodarone can increase plasma concentration of phenytoin, the dose should be reduced. Note that small changes in phenytoin dose can result in large changes in levels. Monitor patient and counsel on signs of toxicity.  []  Beta blockers: increased risk of bradycardia, AV block and myocardial depression. Sotalol - avoid concomitant use.  []   Calcium channel blockers (diltiazem and verapamil): increased risk of bradycardia, AV block and myocardial depression.  []   Cyclosporine: Amiodarone increases levels of cyclosporine. Reduced dose of cyclosporine is recommended.  []  Digoxin dose should be halved when amiodarone is started.  []  Diuretics: increased risk of cardiotoxicity if hypokalemia occurs.  []  Oral hypoglycemic agents (glyburide, glipizide, glimepiride): increased risk of hypoglycemia. Patient's glucose levels should be monitored closely when initiating amiodarone therapy.   []  Drugs that prolong the QT interval:  Torsades de  pointes risk may be increased with concurrent use - avoid if possible.  Monitor QTc, also keep magnesium/potassium WNL if concurrent therapy can't be avoided. Marland Kitchen Antibiotics: e.g. fluoroquinolones, erythromycin. . Antiarrhythmics: e.g. quinidine, procainamide, disopyramide, sotalol. . Antipsychotics: e.g. phenothiazines, haloperidol.  . Lithium, tricyclic antidepressants, and methadone.  Thank You, Antonietta Jewel, PharmD, [place] Clinical Pharmacist  Phone: [phone] 10/23/2019 5:38 PM  Please check AMION for all [place] phone numbers After 10:00 PM, call [place] [phone]

## 2019-10-23 NOTE — Progress Notes (Signed)
Spoke with Janett Billow, RN regarding PICC.  Patient is pending intubation.  PICC will need to be placed after this procedure.  [facility] will contact later today for placement if patient is stable.

## 2019-10-23 NOTE — Progress Notes (Signed)
Assisted with transport from [phone] to [phone]

## 2019-10-23 NOTE — Progress Notes (Signed)
[place] Progress Note Patient Name: Pedro [name] DOB: 16-Aug-1955 MRN: [id number]   Date of Service  10/23/2019  HPI/Events of Note  Nursing concern about RR = 44 on BiPAP.   eICU Interventions  Plan: 1. Will ask ground team to re-evaluate the patient at bedside.      Intervention Category Major Interventions: Hypoxemia - evaluation and management  Chanley Mcenery [place] 10/23/2019, 3:12 AM

## 2019-10-23 NOTE — Progress Notes (Signed)
Pt. Started to decompensate after intubation around 1400. HR was over 200s and an EKG was obtained showing AFIB RVR. Dr. Shearon Stalls was notified immediately and gave orders to give a amio bolus and metoprolol 5 mg IV STAT.   Pt. Blood pressure was effected immediately, MD ordered for more pressor support to be started.  At 1413, RN and MD unable to palpate pulse so chest compressions were started until doppler became available. Pulse was found at 1415.   During this time, RT put in aline at bedside. MD put in orders for pt. To be proned and paralyzed. Pt. Was too unstable to be proned at this time. Plan to prone deferred at this time.   Labs were drawn and critical values were given to MD and NP Hoffman. Orders were given.   At 1725, pt. Started to going into afib again. Dr. Shearon Stalls notified an ordered another amio bolus. At [place] pt. Converted over to NSR.   Hoffman, NP ordered for tube feeds to be held at this time.  Provider updated with patient status in real time throughout the day. RN will continue to monitor patient.

## 2019-10-23 NOTE — Progress Notes (Signed)
video call completed with wife , who is in [phone]. She is too unstable for a room to room visit. Mr Crass in 0D64 has been made comfort and Hennie Duos is at bedside. 5W RN, Jacelyn Pi was kind enough to assist Mrs Schmale in [place] connect.

## 2019-10-23 NOTE — Progress Notes (Signed)
Peripherally Inserted Central Catheter Placement  The IV Nurse has discussed with the patient and/or persons authorized to consent for the patient, the purpose of this procedure and the potential benefits and risks involved with this procedure.  The benefits include less needle sticks, lab draws from the catheter, and the patient may be discharged home with the catheter. Risks include, but not limited to, infection, bleeding, blood clot (thrombus formation), and puncture of an artery; nerve damage and irregular heartbeat and possibility to perform a PICC exchange if needed/ordered by physician.  Alternatives to this procedure were also discussed.  Bard Power PICC patient education guide, fact sheet on infection prevention and patient information card has been provided to patient /or left at bedside.  Consent obtained from daughter by Neta Mends, RN   PICC Placement Documentation  PICC Triple Lumen 10/23/19 PICC Right Brachial 36 cm 0 cm (Active)  Indication for Insertion or Continuance of Line Prolonged intravenous therapies 10/23/19 1353  Exposed Catheter (cm) 0 cm 10/23/19 1353  Site Assessment Clean;Dry;Intact 10/23/19 1353  Lumen #1 Status Flushed;Saline locked;Blood return noted 10/23/19 1353  Lumen #2 Status Flushed;Saline locked;Blood return noted 10/23/19 1353  Lumen #3 Status Flushed;Saline locked;Blood return noted 10/23/19 1353  Dressing Type Transparent 10/23/19 1353  Dressing Status Clean;Dry;Intact 10/23/19 1353  Antimicrobial disc in place? Yes 10/23/19 1353  Safety Lock Not Applicable 84/21/03 1281  Line Care Connections checked and tightened 10/23/19 1353  Line Adjustment (NICU/IV Team Only) No 10/23/19 1353  Dressing Intervention New dressing 10/23/19 1353  Dressing Change Due 10/30/19 10/23/19 [place], Nicolette Bang 10/23/2019, 1:55 PM

## 2019-10-23 NOTE — Significant Event (Signed)
Transported pt to 3M04 to be started on BiPaP d/t increased WOB and desaturation even on heated HFNC 50L/100% w/ NRB. Report given at bedside to ICU RN.

## 2019-10-23 NOTE — Progress Notes (Signed)
PT Cancellation Note  Patient Details Name: Pedro [name] MRN: [id number] DOB: July 06, 1955   Cancelled Treatment:    Reason Eval/Treat Not Completed: Medical issues which prohibited therapy;Patient not medically ready.  Newly intubated.   10/23/2019  Ginger Carne., PT Acute Rehabilitation Services [phone]  (pager) [phone]  (office)   Tessie Fass Karnell Vanderloop 10/23/2019, 10:40 AM

## 2019-10-23 NOTE — Progress Notes (Signed)
Video connect with daughter. Other daughter and grandson still at bedside.

## 2019-10-23 NOTE — Progress Notes (Signed)
Assisted tele visit to patient with daughter, Angela.  Joshua Soulier M Srinika Delone, RN   

## 2019-10-23 NOTE — Progress Notes (Signed)
OT Cancellation Note  Patient Details Name: Pedro [name] MRN: [id number] DOB: 09/12/1955   Cancelled Treatment:    Reason Eval/Treat Not Completed: Patient not medically ready (Pt newly intubated. Will hold for medical improvement prior to initiating OT POC.)  Zenovia Jarred, MSOT, OTR/L [place] [facility] Office Number: [phone] Pager: [phone]  Zenovia Jarred 10/23/2019, 11:39 AM

## 2019-10-23 NOTE — Procedures (Signed)
Arterial Catheter Insertion Procedure Note  Pedro [name]  [id number]  05-15-1955  Date:10/23/19  Time:3:54 PM    Provider Performing: Bayard Beaver    Procedure: Insertion of Arterial Line [phone]) without US guidance  Indication(s) Blood pressure monitoring and/or need for frequent ABGs  Consent Unable to obtain consent due to emergent nature of procedure.  Anesthesia None   Time Out Verified patient identification, verified procedure, site/side was marked, verified correct patient position, special equipment/implants available, medications/allergies/relevant history reviewed, required imaging and test results available.   Sterile Technique Maximal sterile technique including full sterile barrier drape, hand hygiene, sterile gown, sterile gloves, mask, hair covering, sterile ultrasound probe cover (if used).   Procedure Description Area of catheter insertion was cleaned with chlorhexidine and draped in sterile fashion. Without real-time ultrasound guidance an arterial catheter was placed into the left radial artery.  Appropriate arterial tracings confirmed on monitor.     Complications/Tolerance None; patient tolerated the procedure well.   EBL Minimal   Specimen(s) None

## 2019-10-23 NOTE — Progress Notes (Signed)
NAME:  Pedro [name], MRN:  [id number], DOB:  November 19, 1955, LOS: 2 ADMISSION DATE:  09/27/2019, CONSULTATION DATE:  9/29 REFERRING MD:  Pedro [name], CHIEF COMPLAINT:  Respiratory failure    Brief History   64yo unvaccinated male with hx HTN, GERD, DM admitted 9/27 with covid infection and acute hypoxic respiratory failure. PCCM consulted 9/29 for worsening respiratory status.   History of present illness   64yo unvaccinated male with hx HTN, GERD, DM with POS covid test 9/20 presented 9/27 to ER with worsening cough, dyspnea, fatigue.  Admitted by hospitalist with covid PNA and acute respiratory failure. Started on high folow O2, remdesivir, steroids, baricitinib.  Despite these efforts he continued to have worsening respiratory failure and PCCM called 9/29 for ICU tx.    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   Significant Hospital Events   9/29 Transferred to ICU on BIPAP and Intubated 9/29 PICC placed   Consults:  9/28 PCCM  Procedures:    Significant Diagnostic Tests:    Micro Data:  COVID 9/27>>> POS  BC x 2 9/27>>>  Antimicrobials:    Interim history/subjective:  Tachypneic, tiring, shallow breathing despite HFNC. Tx ICU for bipap. However on arrival persistently RR in the high 40s low 50s.  Appeared tired.  Desaturating. Intubated and PICC placed for additional access. He developed AF with RVR shortly there after in the setting of persistent hypoxemia PaO2 45 on ABG despite FiO2 100% and PEEP 14. Had a near code, but did not lose a pulse.  Arterial line placed and multiple vasopressors started.   Objective   Blood pressure (!) 87/64, pulse (!) 117, temperature 98.3 F (36.8 C), temperature source Axillary, resp. rate (!) 0, height 5\' 7"  (1.702 m),  weight 77.8 kg, SpO2 (!) 65 %.    Vent Mode: PRVC FiO2 (%):  [100 %] 100 % Set Rate:  [14 bmp-35 bmp] 35 bmp Vt Set:  [400 mL] 400 mL PEEP:  [8 cmH20-14 cmH20] 14 cmH20 Plateau Pressure:  [27 cmH20] 27 cmH20   Intake/Output Summary (Last 24 hours) at 10/23/2019 1423 Last data filed at 10/23/2019 1300 Gross per 24 hour  Intake 4557.06 ml  Output --  Net 4557.06 ml   Filed Weights   10/23/2019 1050 10/23/19 0500  Weight: 79.4 kg 77.8 kg    Examination: General: ill appearing male, intubated, dusky HENT: ETT to vent Lungs: resps shallow and rapid. bilateral breath sounds Cardiovascular: tachycardic, irregularly irregular Abdomen: round, soft  Extremities: warm and dry, no sig edema  Neuro: sedated  10/23/2019 ABG 7.09/80.9/45  Resolved Hospital Problem list     Assessment & Plan:   Acute hypoxic respiratory failure r/t covid PNA, Severe ARDS (unvaccinated) PLAN -  Intubated this morning for work of breathing and desaturation.  P/F ratio is 45. Will initiate prone positioning and NMB.  -goal plateau pressure <30, driving pressure <56 cm H2O -target PaO2 55-65, titrate PEEP/FiO2 per ARDS protocol  -if P/F ratio <150, consider prone therapy for 16 hours per day -goal CVP <4, diuresis as necessary -follow intermittent CXR  -solumdedrol 1-2mg /kg for 3-5 days then taper to off pending O2 response -remdesivir, trend LFT's  -Baricitinib x 14 days started 9/29  Circulatory Shock Likely sedation related vs from hypoxemia On vasopressin and norepinephrine. Will titrate down as tolerated goal MAP>65  Elevated D-dimer  BLE venous  doppler neg  PLAN - heparin for DVT ppx.   AKI  Renal protective measures.  Hypertension PLAN -  Hold home anti-HTN meds in the setting of shock   DM  Hold home tradjenta  Levemir, SSI Goal CBG<180  Hypernatremia Increasing free H20 flushes to 200cc q3 hours.    Best practice:  Diet: npo. Will start tube feeds Pain/Anxiety/Delirium  protocol (if indicated): fentanyl, propofol VAP protocol (if indicated): ordered DVT prophylaxis: on [facility] GI prophylaxis: na Glucose control: SSI Mobility: BR Code Status: full Family Communication: daughter Pedro [name] updated 9/29 this morning. Patient's wife is hospitalized on 5W and daughter is at home with covid. Disposition: needs ICU  The patient is critically ill with multiple organ systems failure and requires high complexity decision making for assessment and support, frequent evaluation and titration of therapies, application of advanced monitoring technologies and extensive interpretation of multiple databases.   Critical Care Time devoted to patient care services described in this note is 85 minutes. This time reflects time of care of this [place] . This critical care time does not reflect separately billable procedures or procedure time, teaching time or supervisory time of PA/NP/Med student/Med Resident etc but could involve care discussion time.  Pedro [name] Pulmonary and Critical Care Medicine 10/23/2019 3:09 PM  Pager: [phone] After hours pager: [phone]   Labs   CBC: Recent Labs  Lab 09/30/2019 1012 10/22/19 0358 10/23/19 0833 10/23/19 1411  WBC 9.9 7.4 4.1  --   NEUTROABS  --  6.8 3.5  --   HGB 16.2 14.6 15.4 16.0  HCT 49.2 44.0 46.4 47.0  MCV 91.8 94.2 93.7  --   PLT 366 364 339  --     Basic Metabolic Panel: Recent Labs  Lab 10/05/2019 1012 09/27/2019 1501 10/22/19 0358 10/23/19 0833 10/23/19 1411  NA 135 135 142 147* 151*  K 4.5 4.5 4.5 4.8 4.7  CL 101 100 112* 114*  --   CO2 19* 21* 19* 24  --   GLUCOSE 179* 178* 176* 157*  --   BUN 99* 102* 64* 41*  --   CREATININE 2.04* 2.01* 1.43* 1.25*  --   CALCIUM 8.6* 8.6* 8.0* 8.1*  --   MG  --   --  2.6* 2.6*  --   PHOS  --   --  3.9 1.8*  --    GFR: Estimated Creatinine Clearance: 57.3 mL/min (A) (by C-G formula based on SCr of 1.25 mg/dL (H)). Recent Labs  Lab  10/13/2019 1012 10/08/2019 1324 10/23/2019 1501 10/22/19 0358 10/23/19 0833  PROCALCITON  --   --  0.33  --   --   WBC 9.9  --   --  7.4 4.1  LATICACIDVEN  --  1.7  --   --   --     Liver Function Tests: Recent Labs  Lab 10/08/2019 1012 10/22/19 0358 10/23/19 0833  AST 76* 69* 57*  ALT 37 36 30  ALKPHOS 36* 35* 48  BILITOT 1.4* 1.1 0.8  PROT 6.9 5.8* 5.7*  ALBUMIN 2.9* 2.3* 2.0*   No results for input(s): LIPASE, AMYLASE in the last 168 hours. No results for input(s): AMMONIA in the last 168 hours.  ABG    Component Value Date/Time   PHART 7.099 (LL) 10/23/2019 1411   PCO2ART 80.9 (HH) 10/23/2019 1411   PO2ART 45 (L) 10/23/2019 1411   HCO3 25.1 10/23/2019 1411   TCO2 27 10/23/2019 1411   ACIDBASEDEF 7.0 (H)  10/23/2019 1411   O2SAT 63.0 10/23/2019 1411     Coagulation Profile: No results for input(s): INR, PROTIME in the last 168 hours.  Cardiac Enzymes: No results for input(s): CKTOTAL, CKMB, CKMBINDEX, TROPONINI in the last 168 hours.  HbA1C: Hgb A1c MFr Bld  Date/Time Value Ref Range Status  10/22/2019 03:58 AM 7.3 (H) 4.8 - 5.6 % Final    Comment:    (NOTE) Pre diabetes:          5.7%-6.4%  Diabetes:              >6.4%  Glycemic control for   <7.0% adults with diabetes   09/20/2019 10:10 AM 6.9 (H) 4.6 - 6.5 % Final    Comment:    Glycemic Control Guidelines for People with Diabetes:Non Diabetic:  <6%Goal of Therapy: <7%Additional Action Suggested:  >8%     CBG: Recent Labs  Lab 10/22/19 0812 10/22/19 1230 10/23/19 0228 10/23/19 0732 10/23/19 1116  GLUCAP 166* 146* 193* 147* 132*

## 2019-10-23 NOTE — Consult Note (Signed)
NAME:  Pedro [name], MRN:  [id number], DOB:  [birth year], LOS: 2 ADMISSION DATE:  10/20/2019, CONSULTATION DATE:  9/29 REFERRING MD:  [place], CHIEF COMPLAINT:  Respiratory failure    Brief History   64yo unvaccinated male with hx HTN, GERD, DM admitted 9/27 with covid infection and acute hypoxic respiratory failure. PCCM consulted 9/29 for worsening respiratory status.   History of present illness   64yo unvaccinated male with hx HTN, GERD, DM with POS covid test 9/20 presented 9/27 to ER with worsening cough, dyspnea, fatigue.  Admitted by hospitalist with covid PNA and acute respiratory failure. Started on high folow O2, remdesivir, steroids, baricitinib.  Despite these efforts he continued to have worsening respiratory failure and PCCM called 9/29 for ICU tx.    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   Significant Hospital Events     Consults:    Procedures:    Significant Diagnostic Tests:    Micro Data:  COVID 9/27>>> POS  BC x 2 9/27>>>  Antimicrobials:    Interim history/subjective:  Tachypneic, tiring, shallow breathing despite HFNC. Tx ICU for bipap.   Objective   Blood pressure 120/62, pulse 98, temperature 99.6 F (37.6 C), temperature source Axillary, resp. rate (!) 100, height 5\' 7"  (1.702 m), weight 79.4 kg, SpO2 90 %.    FiO2 (%):  [100 %] 100 %   Intake/Output Summary (Last 24 hours) at 10/23/2019 0235 Last data filed at 10/22/2019 1500 Gross per 24 hour  Intake 2235.37 ml  Output --  Net 2235.37 ml   Filed Weights   10/20/2019 1050  Weight: 79.4 kg    Examination: General: ill appearing male, NAD  HENT: mm dry, NRB, HFNC Lungs: resps shallow, rapid on NRB and HFNC. Diminished  Cardiovascular: s1s2 rrr Abdomen: round,  soft  Extremities: warm and dry, no sig edema  Neuro: awake but drowsy, answers questions, MAE   Resolved Hospital Problem list     Assessment & Plan:   Acute hypoxic respiratory failure r/t covid PNA, ARDS (unvaccinated) PLAN -  Bipap for now - low threshold for intubation if does not tol bipap F/u CXR  F/u ABG -goal plateau pressure <30, driving pressure <95 cm H2O -target PaO2 55-65, titrate PEEP/FiO2 per ARDS protocol  -if P/F ratio <150, consider prone therapy for 16 hours per day -goal CVP <4, diuresis as necessary -follow intermittent CXR  -solumdedrol 1-2mg /kg for 3-5 days then taper to off pending O2 response -remdesivir, trend LFT's  -Baricitinib / Tocilizumab (for patients with escalating O2 needs)   Elevated D-dimer  BLE venous doppler neg  PLAN -   AKI - improved  PLAN -  Gentle IVF  F/u chem   Hx HTN PLAN -  Hold home anti-HTN   DM  PLAN -  Hold home tradjenta  Levemir, SSI    Best practice:  Diet: npo Pain/Anxiety/Delirium protocol (if indicated): na VAP protocol (if indicated): na DVT prophylaxis: sq heparin GI prophylaxis: na Glucose control: SSI Mobility: BR Code Status: full Family Communication: will attempt to call wife - also admitted  Disposition:   Labs   CBC: Recent Labs  Lab 10/08/2019 1012 10/22/19 0358  WBC 9.9 7.4  NEUTROABS  --  6.8  HGB 16.2 14.6  HCT 49.2 44.0  MCV 91.8 94.2  PLT 366 621    Basic Metabolic  Panel: Recent Labs  Lab 10/14/2019 1012 10/12/2019 1501 10/22/19 0358  NA 135 135 142  K 4.5 4.5 4.5  CL 101 100 112*  CO2 19* 21* 19*  GLUCOSE 179* 178* 176*  BUN 99* 102* 64*  CREATININE 2.04* 2.01* 1.43*  CALCIUM 8.6* 8.6* 8.0*  MG  --   --  2.6*  PHOS  --   --  3.9   GFR: Estimated Creatinine Clearance: 54.1 mL/min (A) (by C-G formula based on SCr of 1.43 mg/dL (H)). Recent Labs  Lab 10/05/2019 1012 10/04/2019 1324 10/18/2019 1501 10/22/19 0358  PROCALCITON  --   --  0.33  --   WBC 9.9  --   --   7.4  LATICACIDVEN  --  1.7  --   --     Liver Function Tests: Recent Labs  Lab 10/18/2019 1012 10/22/19 0358  AST 76* 69*  ALT 37 36  ALKPHOS 36* 35*  BILITOT 1.4* 1.1  PROT 6.9 5.8*  ALBUMIN 2.9* 2.3*   No results for input(s): LIPASE, AMYLASE in the last 168 hours. No results for input(s): AMMONIA in the last 168 hours.  ABG    Component Value Date/Time   PHART 7.413 10/23/2019 0136   PCO2ART 35.7 10/23/2019 0136   PO2ART 50.0 (L) 10/23/2019 0136   HCO3 22.3 10/23/2019 0136   ACIDBASEDEF 1.6 10/23/2019 0136   O2SAT 84.9 10/23/2019 0136     Coagulation Profile: No results for input(s): INR, PROTIME in the last 168 hours.  Cardiac Enzymes: No results for input(s): CKTOTAL, CKMB, CKMBINDEX, TROPONINI in the last 168 hours.  HbA1C: Hgb A1c MFr Bld  Date/Time Value Ref Range Status  10/22/2019 03:58 AM 7.3 (H) 4.8 - 5.6 % Final    Comment:    (NOTE) Pre diabetes:          5.7%-6.4%  Diabetes:              >6.4%  Glycemic control for   <7.0% adults with diabetes   09/20/2019 10:10 AM 6.9 (H) 4.6 - 6.5 % Final    Comment:    Glycemic Control Guidelines for People with Diabetes:Non Diabetic:  <6%Goal of Therapy: <7%Additional Action Suggested:  >8%     CBG: Recent Labs  Lab 10/05/2019 1633 10/22/19 0812 10/22/19 1230 10/23/19 0228  GLUCAP 139* 166* 146* 193*    Review of Systems:   As per HPI - All other systems reviewed and were neg.     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   Surgical History    Past Surgical History:  Procedure Laterality Date  . ANTERIOR CERVICAL DECOMP/DISCECTOMY FUSION N/A 04/22/2017   Procedure: ANTERIOR CERVICAL DECOMPRESSION/DISCECTOMY FUSION 1 LEVEL  C6-7;  Surgeon: Eustace Moore, MD;   Location: [place];  Service: Neurosurgery;  Laterality: N/A;  . CHOLECYSTECTOMY       Social History   reports that he has never smoked. He has never used smokeless tobacco. He reports that he does not drink alcohol and does not use drugs.   Family History   His family history includes Breast cancer in his sister; Diabetes in his paternal aunt; Heart disease in his father. There is no history of Colon cancer.   Allergies Allergies  Allergen Reactions  . Penicillins Hives and Rash    Has patient had a PCN reaction  causing immediate rash, facial/tongue/throat swelling, SOB or lightheadedness with hypotension: Yes Has patient had a PCN reaction causing severe rash involving mucus membranes or skin necrosis: No Has patient had a PCN reaction that required hospitalization: No Has patient had a PCN reaction occurring within the last 10 years: Yes If all of the above answers are "NO", then may proceed with Cephalosporin use.   . Fish Oil Nausea And Vomiting and Other (See Comments)    GI upset also   . Eggs Or Egg-Derived Products Nausea And Vomiting     Home Medications  Prior to Admission medications   Medication Sig Start Date End Date Taking? Authorizing Provider  aspirin 81 MG tablet Take 81 mg by mouth every evening.    Yes [provider]  empagliflozin (JARDIANCE) 10 MG TABS tablet Take 1 tablet (10 mg total) by mouth daily. 10/10/19  Yes Midge Minium, MD  fenofibrate 160 MG tablet Take 1 tablet (160 mg total) by mouth daily. 10/10/19  Yes Midge Minium, MD  lisinopril-hydrochlorothiazide (ZESTORETIC) 20-12.5 MG tablet Take 1 tablet by mouth daily. 10/10/19  Yes Midge Minium, MD  metFORMIN (GLUCOPHAGE) 1000 MG tablet Take 1 tablet (1,000 mg total) by mouth 2 (two) times daily with a meal. 10/10/19  Yes Midge Minium, MD  omeprazole (PRILOSEC) 40 MG capsule Take 1 capsule (40 mg total) by mouth daily. 10/10/19  Yes Midge Minium, MD  sertraline  (ZOLOFT) 25 MG tablet Take 1 tablet (25 mg total) by mouth daily. 10/10/19  Yes Midge Minium, MD  simvastatin (ZOCOR) 40 MG tablet Take 1 tablet (40 mg total) by mouth every evening. 10/10/19  Yes Midge Minium, MD  triamcinolone ointment (KENALOG) 0.1 % Apply 1 application topically 2 (two) times daily. 09/20/19 09/19/20 Yes Midge Minium, MD  FREESTYLE LITE test strip Use on strip to test sugars 1-2 times daily. Dx. E11.9 05/09/17   Midge Minium, MD  Lancets (FREESTYLE) lancets 1 each by Other route 3 (three) times daily. PRN. E11.9 09/19/17   Midge Minium, MD     Nickolas Madrid, NP Pulmonary/Critical Care Medicine  10/23/2019  2:35 AM

## 2019-10-23 NOTE — Progress Notes (Signed)
Critical ABG values given to Dr. Shearon Stalls.  RR changed to 35, peep changed to 14.

## 2019-10-23 NOTE — Progress Notes (Signed)
eLink Physician-Brief Progress Note Patient Name: Pedro [name] DOB: 11-27-1955 MRN: [id number]   Date of Service  10/23/2019  HPI/Events of Note  I spoke with patient's daughter Pedro [name] to confirm decision to transition to comfort measures, she confirmed  Comfort measures status, orders entered.  eICU Interventions  See above.        Frederik Pear 10/23/2019, 9:34 PM

## 2019-10-23 NOTE — Progress Notes (Signed)
Initial Nutrition Assessment  DOCUMENTATION CODES:   Not applicable  INTERVENTION:   Initiate tube feeding via OG tube: Vital AF 1.2 at 65 ml/h (1560 ml per day) Prosource TF 45 ml BID  Provides 1952 kcal, 139 gm protein, 1265 ml free water daily  200 ml free water every 6 hours Total free water: 2065 ml   MVI with minerals daily   Recommend replete phosphorus - monitor magnesium and phosphorus   NUTRITION DIAGNOSIS:   Increased nutrient needs related to  (COVID-19 PNA) as evidenced by estimated needs.  GOAL:   Patient will meet greater than or equal to 90% of their needs  MONITOR:   TF tolerance, Labs  REASON FOR ASSESSMENT:   Consult, Ventilator Enteral/tube feeding initiation and management  ASSESSMENT:   Unvaccinated pt with PMH of HTN, GERD, and DM admitted 9/27 with COVID+ PNA and acute hypoxic respiratory failure.   Spoke with pt prior to intubation. Pt on BiPAP at the time. Pt able to confirm weight loss and poor intake PTA.  Per chart review weight has trended down.   9/20 tested positive for COVID-19 9/27 admitted with worsening cough, dyspnea, fatigue 9/29 tx ICU and started on BiPAP; failed and required intubation   Patient is currently intubated on ventilator support MV: 11.7 L/min Temp (24hrs), Avg:99.3 F (37.4 C), Min:98.3 F (36.8 C), Max:101.3 F (38.5 C)  Propofol: 4 ml/hr provides:  Medications reviewed and include: colace, SSI, 4 units levemir BID, solumedrol, miralax  Fentanyl Levophed @ 4 mcg provides 105 kcal  Labs reviewed: Na 147, PO4: 1.8, Magnesium 2.6, Ferritin: 1911, CRP: 27.7 CBG's: 147-132    NUTRITION - FOCUSED PHYSICAL EXAM:    Most Recent Value  Orbital Region No depletion  Upper Arm Region No depletion  Thoracic and Lumbar Region No depletion  Buccal Region No depletion  Temple Region No depletion  Clavicle Bone Region Mild depletion  Clavicle and Acromion Bone Region Mild depletion  Scapular Bone Region  Unable to assess  Dorsal Hand Mild depletion  Patellar Region Mild depletion  Anterior Thigh Region Mild depletion  Posterior Calf Region Mild depletion  Edema (RD Assessment) None  Hair Reviewed  Eyes Reviewed  Mouth Unable to assess  Skin Reviewed  Nails Reviewed       Diet Order:   Diet Order            Diet NPO time specified  Diet effective now                 EDUCATION NEEDS:   No education needs have been identified at this time  Skin:  Skin Assessment: Reviewed RN Assessment  Last BM:  unknown  Height:   Ht Readings from Last 1 Encounters:  10/23/19 5\' 7"  (1.702 m)    Weight:   Wt Readings from Last 1 Encounters:  10/23/19 77.8 kg    Ideal Body Weight:  67.2 kg  BMI:  Body mass index is 26.86 kg/m.  Estimated Nutritional Needs:   Kcal:  1900-2100  Protein:  120-145 grams  Fluid:  >2 L/day  Lockie Pares., RD, LDN, CNSC See AMiON for contact information

## 2019-10-24 MED ORDER — ONDANSETRON 4 MG PO TBDP: 4.0000 mg | ORAL_TABLET | Freq: Four times a day (QID) | ORAL | Status: DC | PRN

## 2019-10-24 MED ORDER — HALOPERIDOL LACTATE 2 MG/ML PO CONC
0.5000 mg | ORAL | Status: DC | PRN
  Filled 2019-10-24: qty 0.3

## 2019-10-24 MED ORDER — GLYCOPYRROLATE 1 MG PO TABS: 1.0000 mg | ORAL_TABLET | ORAL | Status: DC | PRN

## 2019-10-24 MED ORDER — BIOTENE DRY MOUTH MT LIQD: 15.0000 mL | OROMUCOSAL | Status: DC | PRN

## 2019-10-24 MED ORDER — ACETAMINOPHEN 325 MG PO TABS: 650.0000 mg | ORAL_TABLET | Freq: Four times a day (QID) | ORAL | Status: DC | PRN

## 2019-10-24 MED ORDER — HALOPERIDOL 0.5 MG PO TABS: 0.5000 mg | ORAL_TABLET | ORAL | Status: DC | PRN

## 2019-10-24 MED ORDER — LORAZEPAM 2 MG/ML IJ SOLN: 1.0000 mg | INTRAMUSCULAR | Status: DC | PRN

## 2019-10-24 MED ORDER — GLYCOPYRROLATE 0.2 MG/ML IJ SOLN: 0.2000 mg | INTRAMUSCULAR | Status: DC | PRN

## 2019-10-24 MED ORDER — POLYVINYL ALCOHOL 1.4 % OP SOLN: 1.0000 [drp] | Freq: Four times a day (QID) | OPHTHALMIC | Status: DC | PRN

## 2019-10-24 MED ORDER — ONDANSETRON HCL 4 MG/2ML IJ SOLN: 4.0000 mg | Freq: Four times a day (QID) | INTRAMUSCULAR | Status: DC | PRN

## 2019-10-24 MED ORDER — LORAZEPAM 1 MG PO TABS: 1.0000 mg | ORAL_TABLET | ORAL | Status: DC | PRN

## 2019-10-24 MED ORDER — ACETAMINOPHEN 650 MG RE SUPP: 650.0000 mg | Freq: Four times a day (QID) | RECTAL | Status: DC | PRN

## 2019-10-24 MED ORDER — LORAZEPAM 2 MG/ML PO CONC: 1.0000 mg | ORAL | Status: DC | PRN

## 2019-10-24 MED ORDER — HALOPERIDOL LACTATE 5 MG/ML IJ SOLN: 0.5000 mg | INTRAMUSCULAR | Status: DC | PRN

## 2019-10-24 MED ORDER — FENTANYL 2500MCG IN NS 250ML (10MCG/ML) PREMIX INFUSION
0.0000 ug/h | INTRAVENOUS | Status: DC
  Administered 2019-10-24: 100 ug/h via INTRAVENOUS

## 2019-10-25 NOTE — Progress Notes (Addendum)
[facility] Progress Note Patient Name: Pedro [name] DOB: 09-20-1955 MRN: [id number]   Date of Service  2019/11/14  HPI/Events of Note  Patient's family have visited and said their goodbye's and are now ready for full comfort measures and compassionate extubation.  eICU Interventions  Full comfort measures orders and compassionate extubation orders entered. Vecuronium will be discontinued, then propofol continued for a minimum of 1 hour afterwards to eliminate lingering muscle relaxant effects  While patient is not sedated.        [name] U [name] 11/14/19, 12:30 AM

## 2019-10-25 NOTE — Progress Notes (Signed)
Asystole noted on the monitor @ 0151. No spontaneous respirations noted. No palpable pulses noted. Patient pronounced by Caroline Sauger. Maliyah Willets RN and Dorthea Cove RN per MD orders. Patient's daughter, [name], notified of patient's expiration.

## 2019-10-25 NOTE — Procedures (Signed)
Extubation Procedure Note  Patient Details:   Name: Pedro [name] DOB: 1956/01/16 MRN: [id number]   Airway Documentation:  Airway 8 mm (Active)  Secured at (cm) 24 cm 10/23/19 1929  Measured From Lips 10/23/19 1929  Secured Location Right 10/23/19 1929  Secured By [employer] 10/23/19 1929  Tube Holder Repositioned Yes 10/23/19 1500  Cuff Pressure (cm H2O) 30 cm H2O 10/23/19 1929  Site Condition Dry 10/23/19 1929   Vent end date: (not recorded) Vent end time: (not recorded)   Evaluation  O2 sats: stable throughout Complications: No apparent complications Patient did tolerate procedure well. Bilateral Breath Sounds: Clear, Diminished   No  Pt. Extubated per MD order, comfort care applied, pt placed on [place] 2019/11/05, 1:31 AM

## 2019-10-25 NOTE — Progress Notes (Signed)
Family made decision to withdraw care and make patient a DNR/comfort care. Patient's grandson, Thomasena Edis, and daughter, Mickel Baas , came to see patient before care withdrawn. Patient's wife was able to see the patient via video link and his daughter [name] was also able to see him via video link. After visitation was finished, pressors, amio, and vecuronium were d/c'd per MD orders. Patient was extubated @ 0130 and o2 via Bee @ 2LPM placed on patient for comfort. Fentanyl gtt infusing for comfort per MD orders. Patient still nonresponsive. Sats 65%, BP 69/40 via art line. Will monitor

## 2019-10-25 NOTE — Death Summary Note (Signed)
DEATH SUMMARY   Patient Details  Name: Pedro [name] MRN: [id number] DOB: Jun 20, 1955  Admission/Discharge Information   Admit Date:  2019-10-30  Date of Death: Date of Death: 2019/11/02  Time of Death: Time of Death: 0151  Length of Stay: 3  Referring Physician: Midge Minium, MD   Reason(s) for Hospitalization  COVID 19 Pneumonia  Diagnoses  Preliminary cause of death: COVID 04/21/22 Pneumonia Secondary Diagnoses (including complications and co-morbidities):  Active Problems:   COVID-19 virus infection   Pneumonia due to COVID-19 virus   Brief Hospital Course (including significant findings, care, treatment, and services provided and events leading to death)  Pedro [name] was a 64 y.o. year old male with medical history significant of HTN, IIDM,HLD, morbid obesity, GERD, anxiety/depression, presented with new onset of COVID-19 symptoms.  He was not vaccinated.  He was admitted initially to the regular nursing floor and found to have some acute kidney injury and severe dehydration.  He was started on steroids, remdesivir, and monoclonality.  Despite these efforts he had worsening respiratory failure and was transferred to the intensive care unit on 10/23/2019.  Later that morning after failing a trial of BiPAP he was intubated.  Unfortunately after intubation the patient had precipitous decline including atrial fibrillation with RVR, persistent hypoxemia, hypotension.  His wife was admitted to the same hospital on regular nursing floor.  His daughter was notified about his decline but was not able to visit as she was also Covid positive.  Given his persistent hypoxemia and inability to lie prone ventilation, his daughter was notified and their family decision to comfort measures after grandson and daughter were able to visit.  Patient was compassionately extubated and passed away at 2019/11/02 at 1:51 in the morning.  Pertinent Labs and Studies  Significant Diagnostic Studies DG Abd  1 View  Result Date: 10/23/2019 CLINICAL DATA:  Orogastric tube placement. EXAM: ABDOMEN - 1 VIEW COMPARISON:  February 06, 2012. FINDINGS: Distal tip of nasogastric tube is seen in expected position of proximal stomach. Mildly dilated large and small bowel loops are noted concerning for possible ileus or obstruction. Status post cholecystectomy. IMPRESSION: Distal tip of nasogastric tube seen in expected position of proximal stomach. Mildly dilated large and small bowel loops are noted concerning for possible ileus or obstruction. Electronically Signed   By: Marijo Conception M.D.   On: 10/23/2019 12:50   Portable Chest x-ray  Result Date: 10/23/2019 CLINICAL DATA:  Endotracheal tube placement. EXAM: PORTABLE CHEST 1 VIEW COMPARISON:  Same day. FINDINGS: Endotracheal tube is in grossly good position. Nasogastric tube is seen entering stomach. Stable cardiomediastinal silhouette. No pneumothorax or pleural effusion is noted. Stable bilateral lung opacities are noted consistent with multifocal pneumonia. IMPRESSION: Endotracheal and nasogastric tubes in grossly good position. Stable bilateral lung opacities consistent with multifocal pneumonia. Electronically Signed   By: Marijo Conception M.D.   On: 10/23/2019 12:49   DG CHEST PORT 1 VIEW  Result Date: 10/23/2019 CLINICAL DATA:  Hypoxia EXAM: PORTABLE CHEST 1 VIEW COMPARISON:  10-30-2019 FINDINGS: Cardiac enlargement. Progressing bilateral pulmonary infiltrates with air bronchograms. No pleural effusions. No pneumothorax. Postoperative changes in the cervical spine. IMPRESSION: Progressing bilateral pulmonary infiltrates. Electronically Signed   By: Lucienne Capers M.D.   On: 10/23/2019 05:36   DG Chest Portable 1 View  Result Date: 30-Oct-2019 CLINICAL DATA:  COVID-19 positivity with cough and shortness of breath EXAM: PORTABLE CHEST 1 VIEW COMPARISON:  04/20/2017 FINDINGS: Cardiac shadow is within normal limits.  Increased patchy airspace opacities are  noted bilaterally consistent with the given clinical history. No sizable effusion is seen. No bony abnormality is noted. IMPRESSION: Bilateral airspace opacities consistent with the given clinical history of COVID-19 positivity. Electronically Signed   By: Inez Catalina M.D.   On: 09/29/2019 10:57   VAS [place] LOWER EXTREMITY VENOUS (DVT)  Result Date: 10/22/2019  Lower Venous DVT Study Indications: Elevated Ddimer.  Risk Factors: COVID 19 positive. Comparison Study: No prior studies. Performing Technologist: Oliver Hum RVT  Examination Guidelines: A complete evaluation includes B-mode imaging, spectral Doppler, color Doppler, and power Doppler as needed of all accessible portions of each vessel. Bilateral testing is considered an integral part of a complete examination. Limited examinations for reoccurring indications may be performed as noted. The reflux portion of the exam is performed with the patient in reverse Trendelenburg.  +---------+---------------+---------+-----------+----------+--------------+ RIGHT    CompressibilityPhasicitySpontaneityPropertiesThrombus Aging +---------+---------------+---------+-----------+----------+--------------+ CFV      Full           Yes      Yes                                 +---------+---------------+---------+-----------+----------+--------------+ SFJ      Full                                                        +---------+---------------+---------+-----------+----------+--------------+ FV Prox  Full                                                        +---------+---------------+---------+-----------+----------+--------------+ FV Mid   Full                                                        +---------+---------------+---------+-----------+----------+--------------+ FV DistalFull                                                        +---------+---------------+---------+-----------+----------+--------------+ PFV       Full                                                        +---------+---------------+---------+-----------+----------+--------------+ POP      Full           Yes      Yes                                 +---------+---------------+---------+-----------+----------+--------------+ PTV      Full                                                        +---------+---------------+---------+-----------+----------+--------------+  PERO     Full                                                        +---------+---------------+---------+-----------+----------+--------------+   +---------+---------------+---------+-----------+----------+--------------+ LEFT     CompressibilityPhasicitySpontaneityPropertiesThrombus Aging +---------+---------------+---------+-----------+----------+--------------+ CFV      Full           Yes      Yes                                 +---------+---------------+---------+-----------+----------+--------------+ SFJ      Full                                                        +---------+---------------+---------+-----------+----------+--------------+ FV Prox  Full                                                        +---------+---------------+---------+-----------+----------+--------------+ FV Mid   Full                                                        +---------+---------------+---------+-----------+----------+--------------+ FV DistalFull                                                        +---------+---------------+---------+-----------+----------+--------------+ PFV      Full                                                        +---------+---------------+---------+-----------+----------+--------------+ POP      Full           Yes      Yes                                 +---------+---------------+---------+-----------+----------+--------------+ PTV      Full                                                         +---------+---------------+---------+-----------+----------+--------------+ PERO     Full                                                        +---------+---------------+---------+-----------+----------+--------------+  Summary: RIGHT: - There is no evidence of deep vein thrombosis in the lower extremity.  - No cystic structure found in the popliteal fossa.  LEFT: - There is no evidence of deep vein thrombosis in the lower extremity.  - No cystic structure found in the popliteal fossa.  *See table(s) above for measurements and observations. Electronically signed by Deitra Mayo MD on 10/22/2019 at 6:19:00 PM.    Final    [place] EKG SITE RITE  Result Date: 10/23/2019 If Site Rite image not attached, placement could not be confirmed due to current cardiac rhythm.   Microbiology Recent Results (from the past 240 hour(s))  Respiratory Panel by RT PCR (Flu A&B, Covid) - Nasopharyngeal Swab     Status: Abnormal   Collection Time: 09/27/2019 11:00 AM   Specimen: Nasopharyngeal Swab  Result Value Ref Range Status   SARS Coronavirus 2 by RT PCR POSITIVE (A) NEGATIVE Final    Comment: emailed L. Berdik RN 13:05 10/23/2019 ([email or web address]) (NOTE) SARS-CoV-2 target nucleic acids are DETECTED.  SARS-CoV-2 RNA is generally detectable in upper respiratory specimens  during the acute phase of infection. Positive results are indicative of the presence of the identified virus, but do not rule out bacterial infection or co-infection with other pathogens not detected by the test. Clinical correlation with patient history and other diagnostic information is necessary to determine patient infection status. The expected result is Negative.  Fact Sheet for Patients:  PinkCheek.be  Fact Sheet for Healthcare Providers: GravelBags.it  This test is not yet approved or cleared by the [place] FDA and  has been authorized for  detection and/or diagnosis of SARS-CoV-2 by FDA under an Emergency Use Authorization (EUA).  This EUA will remain in effect (meaning this test can be used) for the duration of  the COVID-19  declaration under Section 564(b)(1) of the Act, 21 U.S.C. section 360bbb-3(b)(1), unless the authorization is terminated or revoked sooner.      Influenza A by PCR NEGATIVE NEGATIVE Final   Influenza B by PCR NEGATIVE NEGATIVE Final    Comment: (NOTE) The Xpert Xpress SARS-CoV-2/FLU/RSV assay is intended as an aid in  the diagnosis of influenza from Nasopharyngeal swab specimens and  should not be used as a sole basis for treatment. Nasal washings and  aspirates are unacceptable for Xpert Xpress SARS-CoV-2/FLU/RSV  testing.  Fact Sheet for Patients: PinkCheek.be  Fact Sheet for Healthcare Providers: GravelBags.it  This test is not yet approved or cleared by the [place] FDA and  has been authorized for detection and/or diagnosis of SARS-CoV-2 by  FDA under an Emergency Use Authorization (EUA). This EUA will remain  in effect (meaning this test can be used) for the duration of the  Covid-19 declaration under Section 564(b)(1) of the Act, 21  U.S.C. section 360bbb-3(b)(1), unless the authorization is  terminated or revoked. Performed at [facility], [place] [address], [place], [place] [address]   Blood Culture (routine x 2)     Status: None (Preliminary result)   Collection Time: 10/07/2019  2:13 PM   Specimen: BLOOD  Result Value Ref Range Status   Specimen Description BLOOD LEFT ANTECUBITAL  Final   Special Requests   Final    BOTTLES DRAWN AEROBIC AND ANAEROBIC Blood Culture adequate volume   Culture   Final    NO GROWTH 3 DAYS Performed at [facility], [place] [address] [place], [place] [address]    Report Status PENDING  Incomplete  Blood Culture (routine  x 2)     Status: None (Preliminary result)   Collection  Time: 09/27/2019  2:30 PM   Specimen: BLOOD RIGHT ARM  Result Value Ref Range Status   Specimen Description BLOOD RIGHT ARM  Final   Special Requests   Final    BOTTLES DRAWN AEROBIC AND ANAEROBIC Blood Culture adequate volume   Culture   Final    NO GROWTH 3 DAYS Performed at [facility], [address], [place], [place] [address]    Report Status PENDING  Incomplete  MRSA PCR Screening     Status: None   Collection Time: 10/23/19  2:39 AM   Specimen: Nasal Mucosa; Nasopharyngeal  Result Value Ref Range Status   MRSA by PCR NEGATIVE NEGATIVE Final    Comment:        The GeneXpert MRSA Assay (FDA approved for NASAL specimens only), is one component of a comprehensive MRSA colonization surveillance program. It is not intended to diagnose MRSA infection nor to guide or monitor treatment for MRSA infections. Performed at [facility], [place] [address], [place], [place] [address]     Lab Basic Metabolic Panel: Recent Labs  Lab 09/25/2019 1012 10/12/2019 1012 09/25/2019 1501 10/22/19 0358 10/23/19 0833 10/23/19 1411 10/23/19 1632  NA 135   < > 135 142 147* 151* 149*  K 4.5   < > 4.5 4.5 4.8 4.7 4.4  CL 101  --  100 112* 114*  --  121*  CO2 19*  --  21* 19* 24  --  17*  GLUCOSE 179*  --  178* 176* 157*  --  206*  BUN 99*  --  102* 64* 41*  --  42*  CREATININE 2.04*  --  2.01* 1.43* 1.25*  --  1.35*  CALCIUM 8.6*  --  8.6* 8.0* 8.1*  --  5.8*  MG  --   --   --  2.6* 2.6*  --  2.4  PHOS  --   --   --  3.9 1.8*  --  6.8*   < > = values in this interval not displayed.   Liver Function Tests: Recent Labs  Lab 10/19/2019 1012 10/22/19 0358 10/23/19 0833 10/23/19 1632  AST 76* 69* 57* 318*  ALT 37 36 30 144*  ALKPHOS 36* 35* 48 92  BILITOT 1.4* 1.1 0.8 1.5*  PROT 6.9 5.8* 5.7* 4.0*  ALBUMIN 2.9* 2.3* 2.0* 1.4*   No results for input(s): LIPASE, AMYLASE in the last 168 hours. No results for input(s): AMMONIA in the last 168 hours. CBC: Recent Labs  Lab  09/27/2019 1012 10/22/19 0358 10/23/19 0833 10/23/19 1411 10/23/19 1752  WBC 9.9 7.4 4.1  --  15.8*  NEUTROABS  --  6.8 3.5  --   --   HGB 16.2 14.6 15.4 16.0 15.5  HCT 49.2 44.0 46.4 47.0 51.6  MCV 91.8 94.2 93.7  --  100.8*  PLT 366 364 339  --  417*   Cardiac Enzymes: No results for input(s): CKTOTAL, CKMB, CKMBINDEX, TROPONINI in the last 168 hours. Sepsis Labs: Recent Labs  Lab 10/10/2019 1012 10/05/2019 1324 10/12/2019 1501 10/22/19 0358 10/23/19 0833 10/23/19 1649 10/23/19 1752  PROCALCITON  --   --  0.33  --   --   --   --   WBC 9.9  --   --  7.4 4.1  --  15.8*  LATICACIDVEN  --  1.7  --   --   --  3.7*  --  Spero Geralds 19-Nov-2019, 1:09 PM

## 2019-10-25 DEATH — deceased

## 2019-10-26 LAB — CULTURE, BLOOD (ROUTINE X 2)
Culture: NO GROWTH
Culture: NO GROWTH
Special Requests: ADEQUATE
Special Requests: ADEQUATE
